# Patient Record
Sex: Female | Born: 1980 | Race: White | Hispanic: No | Marital: Married | Smoking: Never smoker
Health system: Southern US, Community
[De-identification: ages and names within clinical notes are randomized; demographics above are authoritative.]

## PROBLEM LIST (undated history)

## (undated) DIAGNOSIS — I2699 Other pulmonary embolism without acute cor pulmonale: Secondary | ICD-10-CM

## (undated) DIAGNOSIS — I809 Phlebitis and thrombophlebitis of unspecified site: Secondary | ICD-10-CM

## (undated) DIAGNOSIS — E7800 Pure hypercholesterolemia, unspecified: Secondary | ICD-10-CM

## (undated) DIAGNOSIS — D649 Anemia, unspecified: Secondary | ICD-10-CM

## (undated) DIAGNOSIS — R51 Headache: Principal | ICD-10-CM

## (undated) DIAGNOSIS — M1990 Unspecified osteoarthritis, unspecified site: Secondary | ICD-10-CM

## (undated) HISTORY — DX: Anemia, unspecified: D64.9

## (undated) HISTORY — DX: Pure hypercholesterolemia, unspecified: E78.00

## (undated) HISTORY — DX: Unspecified osteoarthritis, unspecified site: M19.90

## (undated) HISTORY — DX: Headache: R51

## (undated) HISTORY — DX: Other pulmonary embolism without acute cor pulmonale: I26.99

---

## 2008-10-18 HISTORY — PX: TUBAL LIGATION: SHX77

## 2010-08-18 ENCOUNTER — Emergency Department: Payer: Self-pay | Admitting: Emergency Medicine

## 2010-10-16 ENCOUNTER — Emergency Department: Payer: Self-pay | Admitting: Emergency Medicine

## 2010-12-28 ENCOUNTER — Emergency Department: Payer: Self-pay | Admitting: Emergency Medicine

## 2011-02-14 ENCOUNTER — Emergency Department: Payer: Self-pay | Admitting: Emergency Medicine

## 2011-06-02 ENCOUNTER — Emergency Department: Payer: Self-pay | Admitting: Emergency Medicine

## 2011-06-08 ENCOUNTER — Emergency Department: Payer: Self-pay | Admitting: Emergency Medicine

## 2011-06-15 ENCOUNTER — Ambulatory Visit (INDEPENDENT_AMBULATORY_CARE_PROVIDER_SITE_OTHER): Payer: Self-pay

## 2011-06-15 ENCOUNTER — Inpatient Hospital Stay (INDEPENDENT_AMBULATORY_CARE_PROVIDER_SITE_OTHER)
Admission: RE | Admit: 2011-06-15 | Discharge: 2011-06-15 | Disposition: A | Discharge status: Home / Self Care | Payer: Self-pay | Source: Ambulatory Visit | Attending: Family Medicine | Admitting: Family Medicine

## 2011-06-15 DIAGNOSIS — M722 Plantar fascial fibromatosis: Secondary | ICD-10-CM

## 2011-09-02 ENCOUNTER — Encounter: Payer: Self-pay | Admitting: Emergency Medicine

## 2011-09-02 ENCOUNTER — Emergency Department (INDEPENDENT_AMBULATORY_CARE_PROVIDER_SITE_OTHER)
Admission: EM | Admit: 2011-09-02 | Discharge: 2011-09-02 | Disposition: A | Discharge status: Home / Self Care | Payer: Self-pay | Source: Home / Self Care | Attending: Emergency Medicine | Admitting: Emergency Medicine

## 2011-09-02 DIAGNOSIS — J029 Acute pharyngitis, unspecified: Secondary | ICD-10-CM

## 2011-09-02 DIAGNOSIS — K089 Disorder of teeth and supporting structures, unspecified: Secondary | ICD-10-CM

## 2011-09-02 DIAGNOSIS — H9209 Otalgia, unspecified ear: Secondary | ICD-10-CM

## 2011-09-02 DIAGNOSIS — K0889 Other specified disorders of teeth and supporting structures: Secondary | ICD-10-CM

## 2011-09-02 MED ORDER — ACETAMINOPHEN-CODEINE #3 300-30 MG PO TABS: 1.0000 | ORAL_TABLET | Freq: Four times a day (QID) | ORAL | Status: AC | PRN

## 2011-09-02 NOTE — ED Provider Notes (Addendum)
History     CSN: [id number] Arrival date & time: 09/02/2011  8:32 AM   First MD Initiated Contact with Patient 09/02/11 [phone]      Chief Complaint  Patient presents with  . Sore Throat    (Consider location/radiation/quality/duration/timing/severity/associated sxs/prior treatment) HPI Comments: My upper R tooth has been bothering me for about 2 week,then started with pain in my ear,wanted to make sure is not infected or something".. Since Monday been having a sore throat and runny and congested nose a bit of cough"  Patient is a 30 y.o. female presenting with ear pain.  Otalgia This is a new problem. There is pain in the right ear. The problem occurs every several days. The problem has not changed since onset.There has been no fever. The pain is at a severity of 3/10. The pain is moderate. Associated symptoms include rhinorrhea, sore throat and cough. Pertinent negatives include no ear discharge, no hearing loss and no rash. Her past medical history does not include chronic ear infection, hearing loss or tympanostomy tube.    History reviewed. No pertinent past medical history.  Past Surgical History  Procedure Date  . Tubal ligation     History reviewed. No pertinent family history.  History  Substance Use Topics  . Smoking status: Never Smoker   . Smokeless tobacco: Not on file  . Alcohol Use: No    OB History    Grav Para Term Preterm Abortions TAB SAB Ect Mult Living                  Review of Systems  HENT: Positive for ear pain, sore throat and rhinorrhea. Negative for hearing loss and ear discharge.   Respiratory: Positive for cough.   Skin: Negative for rash.    Allergies  Review of patient's allergies indicates no known allergies.  Home Medications   Current Outpatient Rx  Name Route Sig Dispense Refill  . ACETAMINOPHEN-CODEINE #3 300-30 MG PO TABS Oral Take 1-2 tablets by mouth every 6 (six) hours as needed for pain. 15 tablet 0    BP 108/70   Pulse 81  Temp(Src) 97.5 F (36.4 C) (Oral)  Resp 18  SpO2 100%  LMP 08/31/2011  Physical Exam  Constitutional: Vital signs are normal. She appears well-developed and well-nourished. No distress.  HENT:  Head: Normocephalic.  Right Ear: Tympanic membrane normal.  Left Ear: Tympanic membrane normal.  Mouth/Throat: Uvula is midline and mucous membranes are normal. No dental abscesses, uvula swelling or dental caries. Posterior oropharyngeal erythema present. No tonsillar abscesses.  Eyes: Conjunctivae are normal. Right eye exhibits no discharge. Left eye exhibits no discharge.  Neck: Normal range of motion.  Pulmonary/Chest: Effort normal and breath sounds normal. No respiratory distress. She has no wheezes.  Lymphadenopathy:    She has no cervical adenopathy.  Neurological: She is alert.  Skin: Skin is warm.    ED Course  Procedures (including critical care time)   Labs Reviewed  POCT RAPID STREP A ([facility] CARE ONLY)   No results found.   1. Pharyngitis   2. Otalgia   3. Pain, dental       MDM  Pharyngitis- otalgia secondary to odontalgias?        Jimmie Molly, MD 09/02/11 5621  Jimmie Molly, MD 09/02/11 [phone]

## 2011-09-02 NOTE — ED Notes (Signed)
Initially had an earache thinking it was a tooth issue, onset 2 weeks ago.  Sore throat, stuffy head onset Monday.  Spouse with similar symptoms

## 2011-11-26 ENCOUNTER — Ambulatory Visit: Payer: Self-pay | Admitting: Family

## 2012-02-22 ENCOUNTER — Ambulatory Visit (INDEPENDENT_AMBULATORY_CARE_PROVIDER_SITE_OTHER): Payer: Self-pay | Admitting: Family

## 2012-02-22 ENCOUNTER — Encounter: Payer: Self-pay | Admitting: Family

## 2012-02-22 VITALS — BP 100/70 | HR 73 | Temp 97.7°F | Ht 65.25 in | Wt 171.0 lb

## 2012-02-22 DIAGNOSIS — Z111 Encounter for screening for respiratory tuberculosis: Secondary | ICD-10-CM

## 2012-02-22 NOTE — Progress Notes (Signed)
  Subjective:    Patient ID: Sarah Leach, female    DOB: [birth year], 31 y.o.   MRN: [id number]  HPI 31 year old WF is in today to be established and to have a TB skin test administerd. She denies any concerns today. She will be working for [employer], 3rd shift.     Review of Systems  Constitutional: Negative.   HENT: Negative.   Respiratory: Negative.   Cardiovascular: Negative.   Skin: Negative.   Neurological: Negative.   Hematological: Negative.   Psychiatric/Behavioral: Negative.    History reviewed. No pertinent past medical history.  History   Social History  . Marital Status: Married    Spouse Name: N/A    Number of Children: N/A  . Years of Education: N/A   Occupational History  . Not on file.   Social History Main Topics  . Smoking status: Never Smoker   . Smokeless tobacco: Never Used  . Alcohol Use: No  . Drug Use: No  . Sexually Active: Not on file   Other Topics Concern  . Not on file   Social History Narrative  . No narrative on file    Past Surgical History  Procedure Date  . Tubal ligation 2010    Family History  Problem Relation Age of Onset  . Arthritis Maternal Grandmother   . Hearing loss Maternal Grandmother   . Arthritis Maternal Grandfather   . Hearing loss Maternal Grandfather     No Known Allergies  No current outpatient prescriptions on file prior to visit.    BP 100/70  Pulse 73  Temp(Src) 97.7 F (36.5 C) (Oral)  Ht 5' 5.25" (1.657 m)  Wt 171 lb (77.565 kg)  BMI 28.24 kg/m2  SpO2 97%  LMP 04/15/2013chart    Objective:   Physical Exam  Constitutional: She is oriented to person, place, and time. She appears well-developed and well-nourished.  Neck: Normal range of motion. Neck supple.  Cardiovascular: Normal rate, regular rhythm and normal heart sounds.   Pulmonary/Chest: Effort normal.  Neurological: She is alert and oriented to person, place, and time.  Skin: Skin is warm and dry.    Psychiatric: She has a normal mood and affect.          Assessment & Plan:  Assessment: Encounter for TB skin test  Plan:TB skin test administered. Patient will return in 2 days to have it read.

## 2012-02-24 LAB — TB SKIN TEST: Induration: 0

## 2012-03-13 ENCOUNTER — Emergency Department (HOSPITAL_COMMUNITY)
Admission: EM | Admit: 2012-03-13 | Discharge: 2012-03-13 | Disposition: A | Discharge status: Home / Self Care | Payer: Self-pay | Attending: Emergency Medicine | Admitting: Emergency Medicine

## 2012-03-13 DIAGNOSIS — K029 Dental caries, unspecified: Secondary | ICD-10-CM | POA: Insufficient documentation

## 2012-03-13 DIAGNOSIS — K0889 Other specified disorders of teeth and supporting structures: Secondary | ICD-10-CM

## 2012-03-13 MED ORDER — PENICILLIN V POTASSIUM 500 MG PO TABS: 500.0000 mg | ORAL_TABLET | Freq: Three times a day (TID) | ORAL | Status: AC

## 2012-03-13 MED ORDER — OXYCODONE-ACETAMINOPHEN 5-325 MG PO TABS
2.0000 | ORAL_TABLET | Freq: Once | ORAL | Status: AC
  Administered 2012-03-13: 2 via ORAL
  Filled 2012-03-13: qty 2

## 2012-03-13 MED ORDER — OXYCODONE-ACETAMINOPHEN 5-325 MG PO TABS: 1.0000 | ORAL_TABLET | Freq: Four times a day (QID) | ORAL | Status: AC | PRN

## 2012-03-13 NOTE — Discharge Instructions (Signed)
You have a dental injury. Use the resource guide listed below to help you find a dentist if you do not already have one to followup with. It is very important that you get evaluated by a dentist as soon as possible. Call tomorrow to schedule an appointment. Use your pain medication as prescribed and do not operate heavy machinery while on pain medication. Note that your pain medication contains acetaminophen (Tylenol) & its is not reccommended that you use additional acetaminophen (Tylenol) while taking this medication. Take your full course of antibiotics. Read the instructions below.  Eat a soft or liquid diet and rinse your mouth out after meals with warm water. You should see a dentist or return here at once if you have increased swelling, increased pain or uncontrolled bleeding from the site of your injury.   SEEK MEDICAL CARE IF:   You have increased pain not controlled with medicines.   You have swelling around your tooth, in your face or neck.   You have bleeding which starts, continues, or gets worse.   You have a fever >101  If you are unable to open your mouth  RESOURCE GUIDE  Dental Problems  Patients with Medicaid: [facility]                     [facility] [address]                                           [address] Phone:  [phone]                                                  Phone:  [phone]  If unable to pay or uninsured, contact:  [facility] or [facility] to become qualified for the adult dental clinic.  Chronic Pain Problems Contact [facility]  [phone] Patients need to be referred by their primary care doctor.  Insufficient Money for Medicine Contact [employer]:  call "211" or [facility] 271-5999.  No Primary Care Doctor Call [facility]  832-8000 Other agencies that provide inexpensive medical care    [facility]  832-8035    [facility]  832-7272    [facility]  [phone]    [facility]  832-4777    [facility]  [phone]    [facility]  [phone]  Psychological Services [facility]  [phone] [facility]  [phone] [facility]   800 853-5163 (emergency services 641-4993)  Substance Abuse Resources [facility]  336-882-2125 [facility] 336-784-9470 [facility] 336-285-9073 [facility] 336-845-3988 Residential & Outpatient Substance Abuse Program  800-659-3381  Abuse/Neglect [place] Child Abuse Hotline (336) 641-3795 [place] Child Abuse Hotline 800-378-5315 (After Hours)  Emergency Shelter [facility] (336) 271-5985  [facility] (336) 275-9566 [facility] (704) 372-4663  MRSA Hotline #:   832-7006    [place] Resources  [facility] [place]     [employer]                            [facility] [address] [place]                       [address]      [address]  [place]                                                [place]                            [place] Phone:  [phone]                                   Phone:  [phone]                 Phone:  [phone]  [facility] Phone:  [phone]  [place] Child Abuse Hotline (336) 342-1394 (336) 342-3537 (After Hours)    

## 2012-03-13 NOTE — ED Provider Notes (Signed)
History     CSN: [id number]  Arrival date & time 03/13/12  0935   First MD Initiated Contact with Patient 03/13/12 [phone]      No chief complaint on file.   (Consider location/radiation/quality/duration/timing/severity/associated sxs/prior treatment) HPI Comments: Patient presents with right lower molar tooth pain for the past 2 days.  She does not have a dentist.  She has tried taking Aleve and Tylenol for the pain, which has not helped.    Patient is a 31 y.o. female presenting with tooth pain. The history is provided by the patient.  Dental PainThe primary symptoms include mouth pain. Primary symptoms do not include dental injury, oral bleeding, oral lesions, fever, shortness of breath or angioedema. The symptoms began 2 days ago. The symptoms are worsening. The symptoms occur constantly.  Additional symptoms include: dental sensitivity to temperature, gum swelling, gum tenderness and facial swelling. Additional symptoms do not include: purulent gums, trismus, jaw pain, trouble swallowing, pain with swallowing, excessive salivation, drooling and ear pain.    No past medical history on file.  Past Surgical History  Procedure Date  . Tubal ligation 2010    Family History  Problem Relation Age of Onset  . Arthritis Maternal Grandmother   . Hearing loss Maternal Grandmother   . Arthritis Maternal Grandfather   . Hearing loss Maternal Grandfather     History  Substance Use Topics  . Smoking status: Never Smoker   . Smokeless tobacco: Never Used  . Alcohol Use: No    OB History    Grav Para Term Preterm Abortions TAB SAB Ect Mult Living                  Review of Systems  Constitutional: Negative for fever and chills.  HENT: Positive for facial swelling and dental problem. Negative for ear pain, drooling, trouble swallowing, neck pain and neck stiffness.   Respiratory: Negative for shortness of breath.   Gastrointestinal: Negative for nausea and vomiting.     Allergies  Review of patient's allergies indicates no known allergies.  Home Medications   Current Outpatient Rx  Name Route Sig Dispense Refill  . NAPROXEN SODIUM 220 MG PO TABS Oral Take 220 mg by mouth 2 (two) times daily as needed. For pain.      BP 133/91  Pulse 81  Temp(Src) 97.4 F (36.3 C) (Oral)  Resp 16  SpO2 99%  LMP 03/01/2012  Physical Exam  Nursing note and vitals reviewed. Constitutional: She is oriented to person, place, and time. She appears well-developed and well-nourished. She appears distressed.       tearful  HENT:  Head: Normocephalic and atraumatic. No trismus in the jaw.  Mouth/Throat: Uvula is midline, oropharynx is clear and moist and mucous membranes are normal. Abnormal dentition. No dental abscesses or uvula swelling. No oropharyngeal exudate, posterior oropharyngeal edema, posterior oropharyngeal erythema or tonsillar abscesses.         Poor dental hygiene. Pt able to open and close mouth with out difficulty. Airway intact. Uvula midline. Mild gingival swelling with tenderness over affected area, but no fluctuance. No swelling or tenderness of submental and submandibular regions.  Eyes: Conjunctivae and EOM are normal.  Neck: Normal range of motion and full passive range of motion without pain. Neck supple.  Cardiovascular: Normal rate, regular rhythm and normal heart sounds.   Pulmonary/Chest: Effort normal and breath sounds normal. No stridor. No respiratory distress. She has no wheezes.  Musculoskeletal: Normal range of motion.  Lymphadenopathy:  Head (right side): No submental, no submandibular, no tonsillar, no preauricular and no posterior auricular adenopathy present.       Head (left side): No submental, no submandibular, no tonsillar, no preauricular and no posterior auricular adenopathy present.    She has no cervical adenopathy.  Neurological: She is alert and oriented to person, place, and time.  Skin: Skin is warm and dry.  No rash noted. She is not diaphoretic.  Psychiatric: She has a normal mood and affect.    ED Course  Procedures (including critical care time)  Labs Reviewed - No data to display No results found.   No diagnosis found.    MDM  Patient with toothache.  No gross abscess.  Exam unconcerning for Ludwig's angina or spread of infection.  Will treat with penicillin and pain medicine.  Urged patient to follow-up with dentist.          Pascal Lux [place], PA-C 03/13/12 1104

## 2012-03-13 NOTE — ED Notes (Signed)
Started Saturday with tooth pain. Now very painful and swollen.

## 2012-03-15 NOTE — ED Provider Notes (Signed)
History/physical exam/procedure(s) were performed by non-physician practitioner and as supervising physician I was immediately available for consultation/collaboration. I have reviewed all notes and am in agreement with care and plan.   Alexya Mcdaris S Lyric Hoar, MD 03/15/12 1859 

## 2012-06-08 ENCOUNTER — Encounter (HOSPITAL_COMMUNITY): Payer: Self-pay | Admitting: Emergency Medicine

## 2012-06-08 ENCOUNTER — Emergency Department (HOSPITAL_COMMUNITY)
Admission: EM | Admit: 2012-06-08 | Discharge: 2012-06-08 | Disposition: A | Discharge status: Home / Self Care | Payer: Self-pay | Attending: Emergency Medicine | Admitting: Emergency Medicine

## 2012-06-08 DIAGNOSIS — R21 Rash and other nonspecific skin eruption: Secondary | ICD-10-CM | POA: Insufficient documentation

## 2012-06-08 DIAGNOSIS — L255 Unspecified contact dermatitis due to plants, except food: Secondary | ICD-10-CM | POA: Insufficient documentation

## 2012-06-08 DIAGNOSIS — T622X1A Toxic effect of other ingested (parts of) plant(s), accidental (unintentional), initial encounter: Secondary | ICD-10-CM | POA: Insufficient documentation

## 2012-06-08 MED ORDER — DIPHENHYDRAMINE HCL 25 MG PO CAPS
50.0000 mg | ORAL_CAPSULE | Freq: Once | ORAL | Status: AC
  Administered 2012-06-08: 50 mg via ORAL
  Filled 2012-06-08 (×2): qty 1

## 2012-06-08 MED ORDER — DIPHENHYDRAMINE HCL 25 MG PO TABS: 25.0000 mg | ORAL_TABLET | Freq: Four times a day (QID) | ORAL | Status: DC | PRN

## 2012-06-08 MED ORDER — PREDNISONE 20 MG PO TABS: 40.0000 mg | ORAL_TABLET | Freq: Every day | ORAL | Status: AC

## 2012-06-08 MED ORDER — PREDNISONE 20 MG PO TABS
60.0000 mg | ORAL_TABLET | Freq: Once | ORAL | Status: AC
  Administered 2012-06-08: 60 mg via ORAL
  Filled 2012-06-08: qty 3

## 2012-06-08 MED ORDER — PREDNISONE 20 MG PO TABS
ORAL_TABLET | ORAL | Status: AC
  Filled 2012-06-08: qty 1

## 2012-06-08 NOTE — Progress Notes (Signed)
[facility] ED CM contacted by triage RN.  CM spoke with [facility] pharmacy staff who confirms pt is eligible for chs indigent medication assistance.

## 2012-06-08 NOTE — ED Provider Notes (Signed)
History     CSN: [id number]  Arrival date & time 06/08/12  6295   First MD Initiated Contact with Patient 06/08/12 1005      Chief Complaint  Patient presents with  . Rash  . Poison Ivy    (Consider location/radiation/quality/duration/timing/severity/associated sxs/prior treatment) HPI Comments: Sarah Leach 31 y.o. female   The chief complaint is: Patient presents with:   Rash   Poison [place]   The patient has medical history significant for:   History reviewed. No pertinent past medical history.   Patient presents with rash on her arms bilaterally and lower abomen X 1 week. She states that the rash it itch and worse at night. She tried OTC cortisone cream without relief. Patient states she came into contact with posion ivy while working in the yard. Denies changes in lotion, detergent, or soap. Denies constitutional symptoms. Denies NVD. Denies SOB.  Patient is a 31 y.o. female presenting with rash and Poison Ivy. The history is provided by the patient.  Rash   Poison Ivy Associated symptoms include a rash. Pertinent negatives include no chills, fever, nausea or vomiting.    History reviewed. No pertinent past medical history.  Past Surgical History  Procedure Date  . Tubal ligation 2010    Family History  Problem Relation Age of Onset  . Arthritis Maternal Grandmother   . Hearing loss Maternal Grandmother   . Arthritis Maternal Grandfather   . Hearing loss Maternal Grandfather     History  Substance Use Topics  . Smoking status: Never Smoker   . Smokeless tobacco: Never Used  . Alcohol Use: No    OB History    Grav Para Term Preterm Abortions TAB SAB Ect Mult Living                  Review of Systems  Constitutional: Negative for fever and chills.  Respiratory: Negative for shortness of breath.   Gastrointestinal: Negative for nausea, vomiting and diarrhea.  Skin: Positive for rash.  All other systems reviewed and are negative.    Allergies   Review of patient's allergies indicates no known allergies.  Home Medications   Current Outpatient Rx  Name Route Sig Dispense Refill  . NAPROXEN SODIUM 220 MG PO TABS Oral Take 220 mg by mouth 2 (two) times daily as needed. For pain.      BP 122/71  Pulse 64  Temp 98.6 F (37 C) (Oral)  Resp 17  SpO2 100%  LMP 06/01/2012  Physical Exam  Nursing note and vitals reviewed. Constitutional: She appears well-developed and well-nourished. No distress.  HENT:  Head: Normocephalic and atraumatic.  Mouth/Throat: Oropharynx is clear and moist.  Eyes: Conjunctivae and EOM are normal. No scleral icterus.  Neck: Normal range of motion. Neck supple.  Cardiovascular: Normal rate, regular rhythm and normal heart sounds.   Pulmonary/Chest: Effort normal and breath sounds normal.  Abdominal: Soft. Bowel sounds are normal. There is no tenderness.  Neurological: She is alert.  Skin: Skin is warm and dry. Rash noted. There is erythema.       Maculopapular rash distrbuted on the flexoral surfaces of both arm bilaterally. Additional lesions on suprapubic area.    ED Course  Procedures (including critical care time)  Labs Reviewed - No data to display No results found.   1. Rash       MDM  Patient presented with posion ivy x 1 week. OTC cortisone cream without relief. Patient given benadryl and prednisone in ED.  Patient reassessed and improved. Patient will be discharged on benadryl and prednisone. No red flags for contact dermatitis, TENS, SJS. Return precautions given verbally and in discharge summary.        Pixie Casino, PA-C 06/08/12 1124

## 2012-06-08 NOTE — ED Notes (Signed)
Waiting for case manager to provided medications from pharmacy

## 2012-06-08 NOTE — ED Notes (Signed)
Was working in yard last week- thinks has poison ivy, rash on both arms, more on left arm, some on abd.

## 2012-06-08 NOTE — Progress Notes (Signed)
During 06/08/12 ED Visit pt was assisted with [facility] medication assistance program for benadryl and prednisone

## 2012-06-09 NOTE — ED Provider Notes (Signed)
Medical screening examination/treatment/procedure(s) were performed by non-physician practitioner and as supervising physician I was immediately available for consultation/collaboration.  Nya Monds, MD 06/09/12 0817 

## 2012-06-21 ENCOUNTER — Ambulatory Visit: Payer: Self-pay | Admitting: Family

## 2012-06-22 ENCOUNTER — Ambulatory Visit (INDEPENDENT_AMBULATORY_CARE_PROVIDER_SITE_OTHER): Payer: Self-pay | Admitting: Family Medicine

## 2012-06-22 ENCOUNTER — Encounter: Payer: Self-pay | Admitting: Family Medicine

## 2012-06-22 VITALS — BP 116/68 | HR 76 | Temp 99.1°F | Wt 169.0 lb

## 2012-06-22 DIAGNOSIS — L259 Unspecified contact dermatitis, unspecified cause: Secondary | ICD-10-CM

## 2012-06-22 DIAGNOSIS — L509 Urticaria, unspecified: Secondary | ICD-10-CM

## 2012-06-22 MED ORDER — PREDNISONE 10 MG PO TABS: ORAL_TABLET | ORAL | Status: DC

## 2012-06-22 MED ORDER — TRIAMCINOLONE ACETONIDE 0.1 % EX CREA: TOPICAL_CREAM | Freq: Three times a day (TID) | CUTANEOUS | Status: DC

## 2012-06-22 NOTE — Progress Notes (Signed)
  Subjective:    Patient ID: Sarah Leach, female    DOB: 10-05-1981, 31 y.o.   MRN: [id number]  HPI Here for diffuse hives on the arms and trunk for the past 2 weeks. She went to the ER a week ago, and she was given a 9 day taper of prednisone. This helped at first but then the rash worsened again. No SOB. Using Calamine lotion.    Review of Systems  Constitutional: Negative.   Skin: Positive for rash.       Objective:   Physical Exam  Constitutional: She appears well-developed and well-nourished.  Skin:       Diffuse maculopapular red scaly rashes as above           Assessment & Plan:  Hives secondary to contact dermatitis. Use Triamcinolone cream and a longer prednisone taper. Add Aveeno soaks in the tub.

## 2012-08-10 ENCOUNTER — Emergency Department (HOSPITAL_COMMUNITY): Payer: Self-pay

## 2012-08-10 ENCOUNTER — Encounter (HOSPITAL_COMMUNITY): Payer: Self-pay | Admitting: *Deleted

## 2012-08-10 ENCOUNTER — Emergency Department (HOSPITAL_COMMUNITY)
Admission: EM | Admit: 2012-08-10 | Discharge: 2012-08-11 | Disposition: A | Discharge status: Home / Self Care | Payer: Self-pay | Attending: Emergency Medicine | Admitting: Emergency Medicine

## 2012-08-10 DIAGNOSIS — E876 Hypokalemia: Secondary | ICD-10-CM

## 2012-08-10 DIAGNOSIS — R42 Dizziness and giddiness: Secondary | ICD-10-CM | POA: Insufficient documentation

## 2012-08-10 DIAGNOSIS — R109 Unspecified abdominal pain: Secondary | ICD-10-CM

## 2012-08-10 DIAGNOSIS — J069 Acute upper respiratory infection, unspecified: Secondary | ICD-10-CM

## 2012-08-10 DIAGNOSIS — R1031 Right lower quadrant pain: Secondary | ICD-10-CM | POA: Insufficient documentation

## 2012-08-10 DIAGNOSIS — H8110 Benign paroxysmal vertigo, unspecified ear: Secondary | ICD-10-CM

## 2012-08-10 LAB — CBC WITH DIFFERENTIAL/PLATELET
Basophils Absolute: 0 10*3/uL (ref 0.0–0.1)
HCT: 32.1 % — ABNORMAL LOW (ref 36.0–46.0)
Lymphocytes Relative: 23 % (ref 12–46)
Lymphs Abs: 1.3 10*3/uL (ref 0.7–4.0)
MCV: 83.2 fL (ref 78.0–100.0)
Monocytes Absolute: 0.5 10*3/uL (ref 0.1–1.0)
Neutro Abs: 3.7 10*3/uL (ref 1.7–7.7)
Platelets: 340 10*3/uL (ref 150–400)
RBC: 3.86 MIL/uL — ABNORMAL LOW (ref 3.87–5.11)
RDW: 13.3 % (ref 11.5–15.5)
WBC: 5.5 10*3/uL (ref 4.0–10.5)

## 2012-08-10 LAB — COMPREHENSIVE METABOLIC PANEL
ALT: 8 U/L (ref 0–35)
AST: 16 U/L (ref 0–37)
CO2: 25 mEq/L (ref 19–32)
Chloride: 102 mEq/L (ref 96–112)
GFR calc Af Amer: >90 mL/min (ref >90)
GFR calc non Af Amer: >90 mL/min (ref >90)
Glucose, Bld: 100 mg/dL — ABNORMAL HIGH (ref 70–99)
Sodium: 136 mEq/L (ref 135–145)
Total Bilirubin: 0.2 mg/dL — ABNORMAL LOW (ref 0.3–1.2)

## 2012-08-10 MED ORDER — MORPHINE SULFATE 4 MG/ML IJ SOLN
4.0000 mg | Freq: Once | INTRAMUSCULAR | Status: AC
  Administered 2012-08-10: 4 mg via INTRAVENOUS
  Filled 2012-08-10: qty 1

## 2012-08-10 MED ORDER — LORAZEPAM 2 MG/ML IJ SOLN
1.0000 mg | Freq: Once | INTRAMUSCULAR | Status: AC
  Administered 2012-08-10: 1 mg via INTRAVENOUS
  Filled 2012-08-10: qty 1

## 2012-08-10 MED ORDER — POTASSIUM CHLORIDE 10 MEQ/100ML IV SOLN
10.0000 meq | Freq: Once | INTRAVENOUS | Status: AC
  Administered 2012-08-11: 10 meq via INTRAVENOUS
  Filled 2012-08-10: qty 100

## 2012-08-10 MED ORDER — SODIUM CHLORIDE 0.9 % IV BOLUS (SEPSIS)
1000.0000 mL | Freq: Once | INTRAVENOUS | Status: AC
  Administered 2012-08-10: 1000 mL via INTRAVENOUS

## 2012-08-10 MED ORDER — POTASSIUM CHLORIDE CRYS ER 20 MEQ PO TBCR
40.0000 meq | EXTENDED_RELEASE_TABLET | Freq: Once | ORAL | Status: AC
  Administered 2012-08-11: 40 meq via ORAL
  Filled 2012-08-10: qty 2

## 2012-08-10 NOTE — ED Notes (Signed)
Per EMS pt c/onausea and dizziness starting today. No vomiting or diarrhea, VSS.

## 2012-08-10 NOTE — ED Provider Notes (Signed)
History     CSN: [id number]  Arrival date & time 08/10/12  2104   First MD Initiated Contact with Patient 08/10/12 2135      Chief Complaint  Patient presents with  . Nausea  . Dizziness    (Consider location/radiation/quality/duration/timing/severity/associated sxs/prior treatment) HPI History provided by pt.   Pt developed "cold" sx, including non-productive cough, nasal and sinus congestion and rhinorrhea 2 days ago.  This evening she had such severe body aches that she called EMS.  Just prior to EMS arrival, she developed intermittent room-spinning dizziness that occurs when she moves and resolves w/in minutes of keeping still.  Denies headache, vision changes and ataxia.  No prior h/o vertigo.  While en route to hospital, she developed pain in RLQ.  This pain is also aggravated by movement.  Has had a poor appetite over the past 2 days.  Denies fever, change in bowels, urinary and vaginal sx.  Past abd surgeries include tubal ligation.    History reviewed. No pertinent past medical history.  Past Surgical History  Procedure Date  . Tubal ligation 2010    Family History  Problem Relation Age of Onset  . Arthritis Maternal Grandmother   . Hearing loss Maternal Grandmother   . Arthritis Maternal Grandfather   . Hearing loss Maternal Grandfather     History  Substance Use Topics  . Smoking status: Never Smoker   . Smokeless tobacco: Never Used  . Alcohol Use: No    OB History    Grav Para Term Preterm Abortions TAB SAB Ect Mult Living                  Review of Systems  All other systems reviewed and are negative.    Allergies  Review of patient's allergies indicates no known allergies.  Home Medications   Current Outpatient Rx  Name Route Sig Dispense Refill  . DIPHENHYDRAMINE HCL 25 MG PO TABS Oral Take 1 tablet (25 mg total) by mouth every 6 (six) hours as needed for itching (Rash). 30 tablet 0    BP 141/81  Pulse 73  Temp 98.5 F (36.9 C) (Oral)   Resp 16  SpO2 100%  Physical Exam  Nursing note and vitals reviewed. Constitutional: She is oriented to person, place, and time. She appears well-developed and well-nourished. No distress.  HENT:  Head: Normocephalic and atraumatic.  Mouth/Throat: Oropharynx is clear and moist. No oropharyngeal exudate.       Nml ear canal and TM bilaterally  Eyes:       Normal appearance  Neck: Normal range of motion.  Cardiovascular: Normal rate, regular rhythm and intact distal pulses.   Pulmonary/Chest: Effort normal and breath sounds normal. No respiratory distress.       coughing  Abdominal: Soft. Bowel sounds are normal. She exhibits no distension.       Diffuse tenderness, worst in RLQ.  Pt becomes tearful.  Possible peritoneal signs as well (pain when shake hips and tap bottom of right heel).   Musculoskeletal: Normal range of motion.  Neurological: She is alert and oriented to person, place, and time. No sensory deficit. Coordination normal.       CN 3-12 intact.  Rotational nystagmus.  Pt denies dizziness w/ eye movement and head rotation. 5/5 and equal upper and lower extremity strength.  No past pointing.  Unable to assess gait because patient became very dizzy upon standing.   Skin: Skin is warm and dry. No rash noted.  Psychiatric:  She has a normal mood and affect. Her behavior is normal.    ED Course  Procedures (including critical care time)  Labs Reviewed - No data to display No results found.   1. Viral URI   2. Abdominal wall pain   3. Hypokalemia   4. BPPV (benign paroxysmal positional vertigo)       MDM  31yo healthy F pt presents w/ URI sx x 2 days.  Doubt pneumonia based on duration of sx, vital signs and pulmonary exam.  Also c/o room-spinning dizziness that started just prior to EMS arrival this evening.  Most likely BPPV;  no recent head trauma and denies headache, normal TMs and no auditory sx and on exam, movement illicits dizziness and it resolves w/in 10  seconds of rest, rotational nystagmus, no focal neuro deficits.  Dizziness resolved w/ 1mg  IV ativan and patient walks asymptomatically w/ nml gait. Also c/o RLQ pain that started en route to hospital.  Exam concerning for appendicitis.  CT abd neg.  Doubt TOA or ovarian torsion based on characteristics of pain and exam.  Labs sig for hypokalemia; potassium replaced w/ IV and po.  Pt reports that this is a chronic problem for her.  D/c'd home w/ vicodin for pain and strict return precautions discussed.  1:19 AM         Otilio Miu, PA 08/11/12 [phone]

## 2012-08-10 NOTE — ED Notes (Addendum)
Report received from Ajsa, Charity fundraiser.

## 2012-08-11 MED ORDER — IOHEXOL 300 MG/ML  SOLN
100.0000 mL | Freq: Once | INTRAMUSCULAR | Status: AC | PRN
  Administered 2012-08-11: 100 mL via INTRAVENOUS

## 2012-08-11 MED ORDER — HYDROCODONE-ACETAMINOPHEN 5-500 MG PO TABS: 1.0000 | ORAL_TABLET | Freq: Four times a day (QID) | ORAL | Status: DC | PRN

## 2012-08-11 NOTE — ED Notes (Signed)
Discharge instructions reviewed. Rx given x1. All questions answered. Work note given for Friday August 11, 2012

## 2012-08-14 NOTE — ED Provider Notes (Signed)
Medical screening examination/treatment/procedure(s) were performed by non-physician practitioner and as supervising physician I was immediately available for consultation/collaboration.  Juliet Rude. Rubin Payor, MD 08/14/12 [phone]

## 2012-09-04 ENCOUNTER — Encounter: Payer: Self-pay | Admitting: Family

## 2012-09-04 ENCOUNTER — Ambulatory Visit (INDEPENDENT_AMBULATORY_CARE_PROVIDER_SITE_OTHER): Payer: Self-pay | Admitting: Family

## 2012-09-04 VITALS — BP 116/70 | HR 65 | Temp 98.5°F | Wt 175.0 lb

## 2012-09-04 DIAGNOSIS — M545 Low back pain: Secondary | ICD-10-CM

## 2012-09-04 DIAGNOSIS — J019 Acute sinusitis, unspecified: Secondary | ICD-10-CM

## 2012-09-04 MED ORDER — AMOXICILLIN 500 MG PO TABS: 1000.0000 mg | ORAL_TABLET | Freq: Two times a day (BID) | ORAL | Status: AC

## 2012-09-04 NOTE — Progress Notes (Signed)
  Subjective:    Patient ID: Sarah Leach, female    DOB: [birth year], 31 y.o.   MRN: [id number]  HPI 31 year old white female is in today with c/o cough, congestion, sinus pressure worsening x 1 week. She was seen in the ED for similar symptoms 3 weeks ago that she says never really resolved but eventually worsened. She was diagnosed with a viral URI and took OTC medication. Denies any environmental changes. Reports having pain in her lower right back that is worse with movement.   Review of Systems  Constitutional: Positive for fatigue.  HENT: Positive for congestion, sneezing and postnasal drip.   Eyes: Negative.   Respiratory: Positive for cough.   Cardiovascular: Negative.   Musculoskeletal: Negative.   Neurological: Negative.   Hematological: Negative.   Psychiatric/Behavioral: Negative.    No past medical history on file.  History   Social History  . Marital Status: Married    Spouse Name: N/A    Number of Children: N/A  . Years of Education: N/A   Occupational History  . Not on file.   Social History Main Topics  . Smoking status: Never Smoker   . Smokeless tobacco: Never Used  . Alcohol Use: No  . Drug Use: No  . Sexually Active: Not on file   Other Topics Concern  . Not on file   Social History Narrative  . No narrative on file    Past Surgical History  Procedure Date  . Tubal ligation 2010    Family History  Problem Relation Age of Onset  . Arthritis Maternal Grandmother   . Hearing loss Maternal Grandmother   . Arthritis Maternal Grandfather   . Hearing loss Maternal Grandfather     No Known Allergies  Current Outpatient Prescriptions on File Prior to Visit  Medication Sig Dispense Refill  . diphenhydrAMINE (BENADRYL) 25 MG tablet Take 1 tablet (25 mg total) by mouth every 6 (six) hours as needed for itching (Rash).  30 tablet  0  . HYDROcodone-acetaminophen (VICODIN) 5-500 MG per tablet Take 1-2 tablets by mouth every 6 (six) hours as needed  for pain.  20 tablet  0    BP 116/70  Pulse 65  Temp 98.5 F (36.9 C)  Wt 175 lb (79.379 kg)  SpO2 98%  LMP 10/08/2013chart    Objective:   Physical Exam  Constitutional: She is oriented to person, place, and time. She appears well-developed and well-nourished.  HENT:  Right Ear: External ear normal.  Left Ear: External ear normal.  Nose: Nose normal.  Mouth/Throat: Oropharynx is clear and moist.  Neck: Normal range of motion. Neck supple.  Cardiovascular: Normal rate, regular rhythm and normal heart sounds.   Pulmonary/Chest: Effort normal and breath sounds normal.  Musculoskeletal: Normal range of motion. She exhibits tenderness.       Tenderness to palpation of the right lower back.   Neurological: She is alert and oriented to person, place, and time.  Skin: Skin is warm and dry.  Psychiatric: She has a normal mood and affect.          Assessment & Plan:  Assessment: Acute sinusitis, Low back pain  Plan: Ibuprofen as needed for LBP. Due to duration of symptoms, Amoxil 2 caps twice a day x 10 days. OTC symptomatic treatment for relief. Call the office if symptoms worsen or persist. Recheck as scheduled and sooner as needed.

## 2012-09-04 NOTE — Patient Instructions (Signed)

## 2012-10-21 ENCOUNTER — Encounter (HOSPITAL_COMMUNITY): Payer: Self-pay | Admitting: Emergency Medicine

## 2012-10-21 ENCOUNTER — Emergency Department (HOSPITAL_COMMUNITY)
Admission: EM | Admit: 2012-10-21 | Discharge: 2012-10-21 | Disposition: A | Discharge status: Home / Self Care | Payer: Self-pay | Attending: Emergency Medicine | Admitting: Emergency Medicine

## 2012-10-21 ENCOUNTER — Emergency Department (HOSPITAL_COMMUNITY): Payer: Self-pay

## 2012-10-21 DIAGNOSIS — N760 Acute vaginitis: Secondary | ICD-10-CM | POA: Insufficient documentation

## 2012-10-21 DIAGNOSIS — R110 Nausea: Secondary | ICD-10-CM | POA: Insufficient documentation

## 2012-10-21 DIAGNOSIS — Z3202 Encounter for pregnancy test, result negative: Secondary | ICD-10-CM | POA: Insufficient documentation

## 2012-10-21 DIAGNOSIS — R109 Unspecified abdominal pain: Secondary | ICD-10-CM | POA: Insufficient documentation

## 2012-10-21 LAB — URINALYSIS, ROUTINE W REFLEX MICROSCOPIC
Nitrite: NEGATIVE
Protein, ur: NEGATIVE mg/dL
Urobilinogen, UA: 1 mg/dL (ref 0.0–1.0)

## 2012-10-21 LAB — CBC WITH DIFFERENTIAL/PLATELET
Basophils Absolute: 0 10*3/uL (ref 0.0–0.1)
Basophils Relative: 0 % (ref 0–1)
Eosinophils Absolute: 0.1 10*3/uL (ref 0.0–0.7)
MCH: 28.1 pg (ref 26.0–34.0)
MCHC: 33.2 g/dL (ref 30.0–36.0)
Neutrophils Relative %: 63 % (ref 43–77)
Platelets: 405 10*3/uL — ABNORMAL HIGH (ref 150–400)
RDW: 13.6 % (ref 11.5–15.5)

## 2012-10-21 LAB — BASIC METABOLIC PANEL
CO2: 24 mEq/L (ref 19–32)
Calcium: 9.4 mg/dL (ref 8.4–10.5)
Creatinine, Ser: 0.8 mg/dL (ref 0.50–1.10)

## 2012-10-21 LAB — WET PREP, GENITAL

## 2012-10-21 LAB — URINE MICROSCOPIC-ADD ON

## 2012-10-21 LAB — PREGNANCY, URINE: Preg Test, Ur: NEGATIVE

## 2012-10-21 MED ORDER — METRONIDAZOLE 500 MG PO TABS: 500.0000 mg | ORAL_TABLET | Freq: Two times a day (BID) | ORAL | Status: DC

## 2012-10-21 MED ORDER — SODIUM CHLORIDE 0.9 % IV BOLUS (SEPSIS)
500.0000 mL | Freq: Once | INTRAVENOUS | Status: AC
  Administered 2012-10-21: 500 mL via INTRAVENOUS

## 2012-10-21 MED ORDER — ONDANSETRON HCL 4 MG/2ML IJ SOLN
4.0000 mg | Freq: Once | INTRAMUSCULAR | Status: AC
  Administered 2012-10-21: 4 mg via INTRAVENOUS
  Filled 2012-10-21: qty 2

## 2012-10-21 MED ORDER — IOHEXOL 300 MG/ML  SOLN
100.0000 mL | Freq: Once | INTRAMUSCULAR | Status: AC | PRN
  Administered 2012-10-21: 100 mL via INTRAVENOUS

## 2012-10-21 MED ORDER — MORPHINE SULFATE 4 MG/ML IJ SOLN
4.0000 mg | Freq: Once | INTRAMUSCULAR | Status: AC
  Administered 2012-10-21: 4 mg via INTRAVENOUS
  Filled 2012-10-21: qty 1

## 2012-10-21 NOTE — ED Notes (Addendum)
Per EMS. Patient c/o RLQ pain, navel to lower right side. Patient states she is not pregnant, sharp pain, started about 12 hours ago, worseing throughout the day, rating pain 10/10. Last BM 10/20/12 am, tried again later in day, unable to go. Denies taking any medications at home for treatment. Patient denies N/V/D, afebrile.

## 2012-10-21 NOTE — ED Notes (Signed)
[id number]<AV> Expected date:10/20/12<BR> Expected time:11:57 PM<BR> Means of arrival:<BR> Comments:<BR> EMS

## 2012-10-21 NOTE — ED Notes (Signed)
Patient c/o RLQ pain, upon palpitation pain c/o tenderness to RLQ, RUQ, LLQ. Pain rated 10/10. Patient states onset to be about 12 hours ago. Last BM 10/20/12 am. Patient reports nausea, with one episode of emesis approx 2-3 hours ago.

## 2012-10-21 NOTE — ED Notes (Signed)
Pt finished contrast - notified Amie, RN

## 2012-10-21 NOTE — ED Notes (Signed)
CT notified patient has completed contrast  

## 2012-10-21 NOTE — ED Provider Notes (Signed)
History     CSN: [id number]  Arrival date & time 10/21/12  0015   First MD Initiated Contact with Patient 10/21/12 0116      Chief Complaint  Patient presents with  . Abdominal Pain    (Consider location/radiation/quality/duration/timing/severity/associated sxs/prior treatment) Patient is a 32 y.o. female presenting with abdominal pain. The history is provided by the patient.  Abdominal Pain The primary symptoms of the illness include abdominal pain and nausea. The primary symptoms of the illness do not include shortness of breath, vomiting or diarrhea.  Symptoms associated with the illness do not include back pain.   patient presents with abdominal pain and nausea. Began earlier today. It is sharp. It began in the periumbilical area and is now gone to the right lower quadrant. Last bowel movement was today. No fevers. No vaginal bleeding or discharge. Her last period was last week and was normal.  History reviewed. No pertinent past medical history.  Past Surgical History  Procedure Date  . Tubal ligation 2010    Family History  Problem Relation Age of Onset  . Arthritis Maternal Grandmother   . Hearing loss Maternal Grandmother   . Arthritis Maternal Grandfather   . Hearing loss Maternal Grandfather     History  Substance Use Topics  . Smoking status: Never Smoker   . Smokeless tobacco: Never Used  . Alcohol Use: No    OB History    Grav Para Term Preterm Abortions TAB SAB Ect Mult Living                  Review of Systems  Constitutional: Negative for activity change and appetite change.  HENT: Negative for neck stiffness.   Eyes: Negative for pain.  Respiratory: Negative for chest tightness and shortness of breath.   Cardiovascular: Negative for chest pain and leg swelling.  Gastrointestinal: Positive for nausea and abdominal pain. Negative for vomiting and diarrhea.  Genitourinary: Negative for flank pain.  Musculoskeletal: Negative for back pain.  Skin:  Negative for rash.  Neurological: Negative for weakness, numbness and headaches.  Psychiatric/Behavioral: Negative for behavioral problems.    Allergies  Review of patient's allergies indicates no known allergies.  Home Medications   Current Outpatient Rx  Name  Route  Sig  Dispense  Refill  . METRONIDAZOLE 500 MG PO TABS   Oral   Take 1 tablet (500 mg total) by mouth 2 (two) times daily.   14 tablet   0     BP 117/78  Pulse 62  Temp 98.3 F (36.8 C) (Oral)  Resp 14  SpO2 95%  LMP 10/14/2012  Physical Exam  Nursing note and vitals reviewed. Constitutional: She is oriented to person, place, and time. She appears well-developed and well-nourished.  HENT:  Head: Normocephalic and atraumatic.  Eyes: EOM are normal. Pupils are equal, round, and reactive to light.  Neck: Normal range of motion. Neck supple.  Cardiovascular: Normal rate, regular rhythm and normal heart sounds.   No murmur heard. Pulmonary/Chest: Effort normal and breath sounds normal. No respiratory distress. She has no wheezes. She has no rales.  Abdominal: Soft. Bowel sounds are normal. She exhibits no distension. There is tenderness. There is no rebound and no guarding.       Tenderness abdomen, worse in the right lower quadrant. No rash. No masses palpated  Musculoskeletal: Normal range of motion.  Neurological: She is alert and oriented to person, place, and time. No cranial nerve deficit.  Skin: Skin is warm  and dry.  Psychiatric: She has a normal mood and affect. Her speech is normal.  mld white vaginal discharge with some pain with cervical motion.  ED Course  Procedures (including critical care time)  Labs Reviewed  CBC WITH DIFFERENTIAL - Abnormal; Notable for the following:    Hemoglobin 11.7 (*)     HCT 35.2 (*)     Platelets 405 (*)     All other components within normal limits  URINALYSIS, ROUTINE W REFLEX MICROSCOPIC - Abnormal; Notable for the following:    Hgb urine dipstick TRACE (*)      Leukocytes, UA TRACE (*)     All other components within normal limits  BASIC METABOLIC PANEL - Abnormal; Notable for the following:    Glucose, Bld 101 (*)     All other components within normal limits  URINE MICROSCOPIC-ADD ON - Abnormal; Notable for the following:    Squamous Epithelial / LPF FEW (*)     All other components within normal limits  WET PREP, GENITAL - Abnormal; Notable for the following:    Trich, Wet Prep FEW (*)     Clue Cells Wet Prep HPF POC RARE (*)     WBC, Wet Prep HPF POC RARE (*)     All other components within normal limits  PREGNANCY, URINE  GC/CHLAMYDIA PROBE AMP   Ct Abdomen Pelvis W Contrast  10/21/2012  *RADIOLOGY REPORT*  Clinical Data: Right lower quadrant pain, diffuse abdominal tenderness, nausea and vomiting x 1  CT ABDOMEN AND PELVIS WITH CONTRAST  Technique:  Multidetector CT imaging of the abdomen and pelvis was performed following the standard protocol during bolus administration of intravenous contrast. Sagittal and coronal MPR images reconstructed from axial data set.  Contrast: OMNIPAQUE IOHEXOL 300 MG/ML  SOLN Dilute oral contrast.  Comparison: 08/10/2012  Findings: Lung bases clear. Liver, spleen, pancreas, kidneys, and adrenal glands normal. Normal appendix. Small umbilical hernia containing fat. Normal-appearing bladder, ureters, uterus and right ovary. Small left ovarian cyst 2.2 x 1.6 cm image 62, decreased in size since previous exam. Stomach and bowel loops normal appearance. No mass, adenopathy, free fluid or inflammatory process. No acute osseous findings.  IMPRESSION: Small umbilical hernia containing fat. Otherwise negative exam.   Original Report Authenticated By: Ulyses Southward, M.D.      1. Abdominal pain   2. Bacterial vaginosis       MDM  patient with lower abdominal pain. Negative CT. Patient did not tell me initially but has been worked up a couple months ago for the same. Pelvic exam was done and showed only a few  trichomoniasis. Patient was treated with Flagyl. We'll follow Dr. with        Juliet Rude. Rubin Payor, MD 10/21/12 [phone]

## 2012-10-21 NOTE — ED Notes (Signed)
Discharge instructions reviewed. Rx given x1. Bus pass given x1. All questions answered

## 2012-10-22 LAB — GC/CHLAMYDIA PROBE AMP: GC Probe RNA: NEGATIVE

## 2012-11-13 ENCOUNTER — Other Ambulatory Visit (HOSPITAL_COMMUNITY)
Admission: RE | Admit: 2012-11-13 | Discharge: 2012-11-13 | Disposition: A | Discharge status: Home / Self Care | Payer: Self-pay | Source: Ambulatory Visit | Attending: Family | Admitting: Family

## 2012-11-13 ENCOUNTER — Ambulatory Visit (INDEPENDENT_AMBULATORY_CARE_PROVIDER_SITE_OTHER): Payer: Self-pay | Admitting: Family

## 2012-11-13 ENCOUNTER — Encounter: Payer: Self-pay | Admitting: Family

## 2012-11-13 VITALS — BP 120/76 | HR 80 | Temp 97.3°F | Wt 177.0 lb

## 2012-11-13 DIAGNOSIS — Z124 Encounter for screening for malignant neoplasm of cervix: Secondary | ICD-10-CM

## 2012-11-13 DIAGNOSIS — R1031 Right lower quadrant pain: Secondary | ICD-10-CM

## 2012-11-13 DIAGNOSIS — N760 Acute vaginitis: Secondary | ICD-10-CM | POA: Insufficient documentation

## 2012-11-13 DIAGNOSIS — Z01419 Encounter for gynecological examination (general) (routine) without abnormal findings: Secondary | ICD-10-CM | POA: Insufficient documentation

## 2012-11-13 MED ORDER — HYOSCYAMINE SULFATE ER 0.375 MG PO TB12: 0.3750 mg | ORAL_TABLET | Freq: Two times a day (BID) | ORAL | Status: DC | PRN

## 2012-11-13 NOTE — Patient Instructions (Signed)
Irritable Bowel Syndrome  Irritable Bowel Syndrome (IBS) is caused by a disturbance of normal bowel function. Other terms used are spastic colon, mucous colitis, and irritable colon. It does not require surgery, nor does it lead to cancer. There is no cure for IBS. But with proper diet, stress reduction, and medication, you will find that your problems (symptoms) will gradually disappear or improve. IBS is a common digestive disorder. It usually appears in late adolescence or early adulthood. Women develop it twice as often as men.  CAUSES   After food has been digested and absorbed in the small intestine, waste material is moved into the colon (large intestine). In the colon, water and salts are absorbed from the undigested products coming from the small intestine. The remaining residue, or fecal material, is held for elimination. Under normal circumstances, gentle, rhythmic contractions on the bowel walls push the fecal material along the colon towards the rectum. In IBS, however, these contractions are irregular and poorly coordinated. The fecal material is either retained too long, resulting in constipation, or expelled too soon, producing diarrhea.  SYMPTOMS   The most common symptom of IBS is pain. It is typically in the lower left side of the belly (abdomen). But it may occur anywhere in the abdomen. It can be felt as heartburn, backache, or even as a dull pain in the arms or shoulders. The pain comes from excessive bowel-muscle spasms and from the buildup of gas and fecal material in the colon. This pain:   Can range from sharp belly (abdominal) cramps to a dull, continuous ache.   Usually worsens soon after eating.   Is typically relieved by having a bowel movement or passing gas.  Abdominal pain is usually accompanied by constipation. But it may also produce diarrhea. The diarrhea typically occurs right after a meal or upon arising in the morning. The stools are typically soft and watery. They are often  flecked with secretions (mucus).  Other symptoms of IBS include:   Bloating.   Loss of appetite.   Heartburn.   Feeling sick to your stomach (nausea).   Belching   Vomiting   Gas.  IBS may also cause a number of symptoms that are unrelated to the digestive system:   Fatigue.   Headaches.   Anxiety   Shortness of breath   Difficulty in concentrating.   Dizziness.  These symptoms tend to come and go.  DIAGNOSIS   The symptoms of IBS closely mimic the symptoms of other, more serious digestive disorders. So your caregiver may wish to perform a variety of additional tests to exclude these disorders. He/she wants to be certain of learning what is wrong (diagnosis). The nature and purpose of each test will be explained to you.  TREATMENT  A number of medications are available to help correct bowel function and/or relieve bowel spasms and abdominal pain. Among the drugs available are:   Mild, non-irritating laxatives for severe constipation and to help restore normal bowel habits.   Specific anti-diarrheal medications to treat severe or prolonged diarrhea.   Anti-spasmodic agents to relieve intestinal cramps.   Your caregiver may also decide to treat you with a mild tranquilizer or sedative during unusually stressful periods in your life.  The important thing to remember is that if any drug is prescribed for you, make sure that you take it exactly as directed. Make sure that your caregiver knows how well it worked for you.  HOME CARE INSTRUCTIONS    Avoid foods that   are high in fat or oils. Some examples are:heavy cream, butter, frankfurters, sausage, and other fatty meats.   Avoid foods that have a laxative effect, such as fruit, fruit juice, and dairy products.   Cut out carbonated drinks, chewing gum, and "gassy" foods, such as beans and cabbage. This may help relieve bloating and belching.   Bran taken with plenty of liquids may help relieve constipation.   Keep track of what foods seem to trigger  your symptoms.   Avoid emotionally charged situations or circumstances that produce anxiety.   Start or continue exercising.   Get plenty of rest and sleep.  MAKE SURE YOU:    Understand these instructions.   Will watch your condition.   Will get help right away if you are not doing well or get worse.  Document Released: 10/04/2005 Document Revised: 12/27/2011 Document Reviewed: 05/24/2008  [employer] Patient Information 2013 [employer]

## 2012-11-13 NOTE — Progress Notes (Signed)
Subjective:    Patient ID: Sarah Leach, female    DOB: 09/20/1981, 32 y.o.   MRN: [id number]  HPI 32 year old white female, nonsmoker is in with persistent right lower quadrant abdominal pain. She was seen in the emergency department in October for similar pain had a CT scan of the abdomen and pelvis that was negative. Most recently, she was seen in the emergency department on 10/21/2012 and a repeat CT scan that continued to be negative. She was diagnosed with bacterial vaginosis and treated. All other tests were negative. She continues to have persistent, dull, right lower quadrant pain that's worse with eating. She describes it as a 6/10 but characterized as more sharp after eating or after sitting for extended periods of time. She denies any nausea, vomiting, diarrhea, constipation, blood in her stools or foul-smelling stools. Reports a decrease in her appetite some fluid she doesn't want he because she knows that she'll have abdominal pain thereafter. She has about 2 bowel movements per day that are soft and formed. No difficulty defecating. Last Pap smear-unknown. Urine pregnancy test-negative. Resolving small ovarian cyst noted on ultrasound from earlier this month but it's on the left side.   Review of Systems  Constitutional: Positive for activity change.  HENT: Negative.   Respiratory: Negative.   Cardiovascular: Negative.   Gastrointestinal: Positive for abdominal pain. Negative for nausea, diarrhea, constipation and blood in stool.       Right lower quadrant  Genitourinary: Negative.   Musculoskeletal: Negative.   Skin: Negative.   Neurological: Negative.   Psychiatric/Behavioral: Negative.    No past medical history on file.  History   Social History  . Marital Status: Married    Spouse Name: N/A    Number of Children: N/A  . Years of Education: N/A   Occupational History  . Not on file.   Social History Main Topics  . Smoking status: Never Smoker   . Smokeless  tobacco: Never Used  . Alcohol Use: No  . Drug Use: No  . Sexually Active: Yes    Birth Control/ Protection: None   Other Topics Concern  . Not on file   Social History Narrative  . No narrative on file    Past Surgical History  Procedure Date  . Tubal ligation 2010    Family History  Problem Relation Age of Onset  . Arthritis Maternal Grandmother   . Hearing loss Maternal Grandmother   . Arthritis Maternal Grandfather   . Hearing loss Maternal Grandfather     No Known Allergies  Current Outpatient Prescriptions on File Prior to Visit  Medication Sig Dispense Refill  . hyoscyamine (LEVBID) 0.375 MG 12 hr tablet Take 1 tablet (0.375 mg total) by mouth every 12 (twelve) hours as needed for cramping.  60 tablet  0  . metroNIDAZOLE (FLAGYL) 500 MG tablet Take 1 tablet (500 mg total) by mouth 2 (two) times daily.  14 tablet  0    BP 120/76  Pulse 80  Temp 97.3 F (36.3 C) (Oral)  Wt 177 lb (80.287 kg)  SpO2 98%  LMP 12/28/2013chart    Objective:   Physical Exam  Constitutional: She is oriented to person, place, and time. She appears well-developed and well-nourished.  HENT:  Right Ear: External ear normal.  Left Ear: External ear normal.  Nose: Nose normal.  Mouth/Throat: Oropharynx is clear and moist.  Neck: Normal range of motion.  Cardiovascular: Normal rate, regular rhythm and normal heart sounds.   Pulmonary/Chest: Effort normal  and breath sounds normal.  Abdominal: Soft. Bowel sounds are normal. There is tenderness. There is no rebound and no guarding.       Moderate tenderness noted to the right lower quadrant  Genitourinary: Vagina normal and uterus normal. Guaiac negative stool. No vaginal discharge found.  Musculoskeletal: Normal range of motion.  Neurological: She is alert and oriented to person, place, and time.  Skin: Skin is warm and dry.  Psychiatric: She has a normal mood and affect.          Assessment & Plan:  Assessment:  1. Right  lower quadrant pain 2. Irritable bowel syndrome  3. Screening for malignancy of the cervix  Plan: Pap smear sent. Will start Levbid twice a day as needed for pain. Refer to GI. Patient to followup here with any questions or concerns. Otherwise we'll see what GI has to stand up from there.

## 2012-11-14 ENCOUNTER — Encounter: Payer: Self-pay | Admitting: Internal Medicine

## 2012-11-18 ENCOUNTER — Emergency Department (HOSPITAL_COMMUNITY): Payer: Self-pay

## 2012-11-18 ENCOUNTER — Emergency Department (HOSPITAL_COMMUNITY)
Admission: EM | Admit: 2012-11-18 | Discharge: 2012-11-18 | Disposition: A | Discharge status: Home / Self Care | Payer: Self-pay | Attending: Emergency Medicine | Admitting: Emergency Medicine

## 2012-11-18 ENCOUNTER — Encounter (HOSPITAL_COMMUNITY): Payer: Self-pay | Admitting: Emergency Medicine

## 2012-11-18 DIAGNOSIS — Z9851 Tubal ligation status: Secondary | ICD-10-CM | POA: Insufficient documentation

## 2012-11-18 DIAGNOSIS — N83209 Unspecified ovarian cyst, unspecified side: Secondary | ICD-10-CM | POA: Insufficient documentation

## 2012-11-18 DIAGNOSIS — B9689 Other specified bacterial agents as the cause of diseases classified elsewhere: Secondary | ICD-10-CM

## 2012-11-18 DIAGNOSIS — N898 Other specified noninflammatory disorders of vagina: Secondary | ICD-10-CM | POA: Insufficient documentation

## 2012-11-18 DIAGNOSIS — R1031 Right lower quadrant pain: Secondary | ICD-10-CM | POA: Insufficient documentation

## 2012-11-18 DIAGNOSIS — G8929 Other chronic pain: Secondary | ICD-10-CM | POA: Insufficient documentation

## 2012-11-18 DIAGNOSIS — Z3202 Encounter for pregnancy test, result negative: Secondary | ICD-10-CM | POA: Insufficient documentation

## 2012-11-18 DIAGNOSIS — N760 Acute vaginitis: Secondary | ICD-10-CM | POA: Insufficient documentation

## 2012-11-18 DIAGNOSIS — R109 Unspecified abdominal pain: Secondary | ICD-10-CM

## 2012-11-18 LAB — CBC WITH DIFFERENTIAL/PLATELET
Basophils Absolute: 0 10*3/uL (ref 0.0–0.1)
Lymphocytes Relative: 25 % (ref 12–46)
Lymphs Abs: 1.8 10*3/uL (ref 0.7–4.0)
Neutro Abs: 4.9 10*3/uL (ref 1.7–7.7)
Neutrophils Relative %: 66 % (ref 43–77)
Platelets: 321 10*3/uL (ref 150–400)
RBC: 4.17 MIL/uL (ref 3.87–5.11)
RDW: 13.7 % (ref 11.5–15.5)
WBC: 7.3 10*3/uL (ref 4.0–10.5)

## 2012-11-18 LAB — URINALYSIS, ROUTINE W REFLEX MICROSCOPIC
Bilirubin Urine: NEGATIVE
Nitrite: NEGATIVE
Specific Gravity, Urine: 1.027 (ref 1.005–1.030)
pH: 5.5 (ref 5.0–8.0)

## 2012-11-18 LAB — URINE MICROSCOPIC-ADD ON

## 2012-11-18 LAB — PREGNANCY, URINE: Preg Test, Ur: NEGATIVE

## 2012-11-18 LAB — COMPREHENSIVE METABOLIC PANEL
ALT: 7 U/L (ref 0–35)
AST: 14 U/L (ref 0–37)
Alkaline Phosphatase: 59 U/L (ref 39–117)
CO2: 25 mEq/L (ref 19–32)
GFR calc Af Amer: >90 mL/min (ref >90)
GFR calc non Af Amer: 84 mL/min — ABNORMAL LOW (ref >90)
Glucose, Bld: 91 mg/dL (ref 70–99)
Potassium: 3.6 mEq/L (ref 3.5–5.1)
Sodium: 139 mEq/L (ref 135–145)
Total Protein: 7.1 g/dL (ref 6.0–8.3)

## 2012-11-18 LAB — WET PREP, GENITAL: Yeast Wet Prep HPF POC: NONE SEEN

## 2012-11-18 MED ORDER — CEPHALEXIN 500 MG PO CAPS: 500.0000 mg | ORAL_CAPSULE | Freq: Four times a day (QID) | ORAL | Status: DC

## 2012-11-18 MED ORDER — METRONIDAZOLE 500 MG PO TABS
2000.0000 mg | ORAL_TABLET | Freq: Once | ORAL | Status: AC
  Administered 2012-11-18: 2000 mg via ORAL
  Filled 2012-11-18: qty 4

## 2012-11-18 NOTE — ED Notes (Signed)
Pt denies v/d; pt c/o RLQ abdominal pain x 2months; pt denies numbness/tingling; pt denies dizziness/lightheadedness; pt denies chest pain; pt denies difficulty breathing; pt alert and mentating appropriately.

## 2012-11-18 NOTE — ED Provider Notes (Signed)
History     CSN: [id number]  Arrival date & time 11/18/12  1215   First MD Initiated Contact with Patient 11/18/12 1226      Chief Complaint  Patient presents with  . Abdominal Pain    (Consider location/radiation/quality/duration/timing/severity/associated sxs/prior treatment) HPI Comments: Patient presents with right-sided lower abdominal pain since October. This pain is constant  and persistent. She states it goes away for a few minutes at a time and then comes back. She's had previous evaluations including 2 negative CT scans and refer to GI which she states she cannot afford. She denies any nausea or vomiting or change in bowel habits. No fevers, dysuria. Nothing makes the pain better or worse. She came in today because she is tired of the pain it is aggravating her. She's not take anything at home for pain.  The history is provided by the patient.    History reviewed. No pertinent past medical history.  Past Surgical History  Procedure Date  . Tubal ligation 2010    Family History  Problem Relation Age of Onset  . Arthritis Maternal Grandmother   . Hearing loss Maternal Grandmother   . Arthritis Maternal Grandfather   . Hearing loss Maternal Grandfather     History  Substance Use Topics  . Smoking status: Never Smoker   . Smokeless tobacco: Never Used  . Alcohol Use: No    OB History    Grav Para Term Preterm Abortions TAB SAB Ect Mult Living                  Review of Systems  Constitutional: Negative for fever, activity change and appetite change.  HENT: Negative for congestion, sore throat and rhinorrhea.   Respiratory: Negative for cough, chest tightness and shortness of breath.   Cardiovascular: Negative for chest pain.  Gastrointestinal: Positive for abdominal pain. Negative for nausea, vomiting and diarrhea.  Genitourinary: Positive for vaginal discharge. Negative for dysuria.  Musculoskeletal: Negative for back pain.  Skin: Negative for rash.   Neurological: Negative for dizziness, weakness and headaches.    Allergies  Review of patient's allergies indicates no known allergies.  Home Medications  No current outpatient prescriptions on file.  BP 121/66  Pulse 62  Temp 97.8 F (36.6 C) (Oral)  Resp 16  SpO2 100%  LMP 10/14/2012  Physical Exam  Constitutional: She is oriented to person, place, and time. She appears well-developed and well-nourished. No distress.       Appears well, no distress  HENT:  Head: Normocephalic and atraumatic.  Mouth/Throat: Oropharynx is clear and moist. No oropharyngeal exudate.  Eyes: Conjunctivae normal and EOM are normal. Pupils are equal, round, and reactive to light.  Neck: Normal range of motion. Neck supple.  Cardiovascular: Normal rate, regular rhythm and normal heart sounds.   No murmur heard. Pulmonary/Chest: Effort normal. No respiratory distress.  Abdominal: There is tenderness. There is guarding.       TTP RLQ with palpation and voluntary guarding.  Genitourinary: Cervix exhibits discharge. Cervix exhibits no motion tenderness. Right adnexum displays tenderness. Right adnexum displays no mass. Left adnexum displays no mass and no tenderness. Vaginal discharge found.  Musculoskeletal: Normal range of motion. She exhibits no edema and no tenderness.       No CVAT  Neurological: She is alert and oriented to person, place, and time. No cranial nerve deficit. She exhibits normal muscle tone. Coordination normal.  Skin: Skin is warm.    ED Course  Procedures (including critical  care time)  Labs Reviewed  URINALYSIS, ROUTINE W REFLEX MICROSCOPIC - Abnormal; Notable for the following:    APPearance TURBID (*)     Leukocytes, UA MODERATE (*)     All other components within normal limits  CBC WITH DIFFERENTIAL - Abnormal; Notable for the following:    HCT 35.3 (*)     All other components within normal limits  COMPREHENSIVE METABOLIC PANEL - Abnormal; Notable for the following:     GFR calc non Af Amer 84 (*)     All other components within normal limits  WET PREP, GENITAL - Abnormal; Notable for the following:    Clue Cells Wet Prep HPF POC FEW (*)     WBC, Wet Prep HPF POC FEW (*)     All other components within normal limits  URINE MICROSCOPIC-ADD ON - Abnormal; Notable for the following:    Squamous Epithelial / LPF MANY (*)     All other components within normal limits  PREGNANCY, URINE  LIPASE, BLOOD  GC/CHLAMYDIA PROBE AMP   US Transvaginal Non-ob  11/18/2012  *RADIOLOGY REPORT*  Clinical Data:  Right adnexal pain, evaluate for torsion  TRANSABDOMINAL AND TRANSVAGINAL ULTRASOUND OF PELVIS DOPPLER ULTRASOUND OF OVARIES  Technique:  Both transabdominal and transvaginal ultrasound examinations of the pelvis were performed. Transabdominal technique was performed for global imaging of the pelvis including uterus, ovaries, adnexal regions, and pelvic cul-de-sac.  It was necessary to proceed with endovaginal exam following the transabdominal exam to visualize the endometrium.  Color and duplex Doppler ultrasound was utilized to evaluate blood flow to the ovaries.  Comparison:  None.  Findings:  Uterus:  Normal in size and appearance, measuring 7.9 x 3.9 x 5.6 cm.  Endometrium:  Normal in thickness and appearance, measuring 10 mm.  Right ovary: Normal appearance/no adnexal mass, measuring 4.1 x 2.1 x 2.4 cm, and is notable for a 1.6 cm corpus luteal cyst.  Left ovary:   Normal appearance/no adnexal mass, measuring 2.5 x 1.4 x 2.1 cm.  Pulsed Doppler evaluation demonstrates normal low-resistance arterial and venous waveforms in both ovaries.  IMPRESSION: Normal exam.  No evidence of pelvic mass or other significant abnormality.  No sonographic evidence for ovarian torsion.  1.6 cm right corpus luteal cyst.   Original Report Authenticated By: Charline Bills, M.D.    US Pelvis Complete  11/18/2012  *RADIOLOGY REPORT*  Clinical Data:  Right adnexal pain, evaluate for torsion   TRANSABDOMINAL AND TRANSVAGINAL ULTRASOUND OF PELVIS DOPPLER ULTRASOUND OF OVARIES  Technique:  Both transabdominal and transvaginal ultrasound examinations of the pelvis were performed. Transabdominal technique was performed for global imaging of the pelvis including uterus, ovaries, adnexal regions, and pelvic cul-de-sac.  It was necessary to proceed with endovaginal exam following the transabdominal exam to visualize the endometrium.  Color and duplex Doppler ultrasound was utilized to evaluate blood flow to the ovaries.  Comparison:  None.  Findings:  Uterus:  Normal in size and appearance, measuring 7.9 x 3.9 x 5.6 cm.  Endometrium:  Normal in thickness and appearance, measuring 10 mm.  Right ovary: Normal appearance/no adnexal mass, measuring 4.1 x 2.1 x 2.4 cm, and is notable for a 1.6 cm corpus luteal cyst.  Left ovary:   Normal appearance/no adnexal mass, measuring 2.5 x 1.4 x 2.1 cm.  Pulsed Doppler evaluation demonstrates normal low-resistance arterial and venous waveforms in both ovaries.  IMPRESSION: Normal exam.  No evidence of pelvic mass or other significant abnormality.  No sonographic evidence for ovarian  torsion.  1.6 cm right corpus luteal cyst.   Original Report Authenticated By: Charline Bills, M.D.    [place] Art/ven Flow Abd Pelv Doppler  11/18/2012  *RADIOLOGY REPORT*  Clinical Data:  Right adnexal pain, evaluate for torsion  TRANSABDOMINAL AND TRANSVAGINAL ULTRASOUND OF PELVIS DOPPLER ULTRASOUND OF OVARIES  Technique:  Both transabdominal and transvaginal ultrasound examinations of the pelvis were performed. Transabdominal technique was performed for global imaging of the pelvis including uterus, ovaries, adnexal regions, and pelvic cul-de-sac.  It was necessary to proceed with endovaginal exam following the transabdominal exam to visualize the endometrium.  Color and duplex Doppler ultrasound was utilized to evaluate blood flow to the ovaries.  Comparison:  None.  Findings:  Uterus:  Normal  in size and appearance, measuring 7.9 x 3.9 x 5.6 cm.  Endometrium:  Normal in thickness and appearance, measuring 10 mm.  Right ovary: Normal appearance/no adnexal mass, measuring 4.1 x 2.1 x 2.4 cm, and is notable for a 1.6 cm corpus luteal cyst.  Left ovary:   Normal appearance/no adnexal mass, measuring 2.5 x 1.4 x 2.1 cm.  Pulsed Doppler evaluation demonstrates normal low-resistance arterial and venous waveforms in both ovaries.  IMPRESSION: Normal exam.  No evidence of pelvic mass or other significant abnormality.  No sonographic evidence for ovarian torsion.  1.6 cm right corpus luteal cyst.   Original Report Authenticated By: Charline Bills, M.D.      No diagnosis found.    MDM  Acute on chronic abdominal pain x 4 months.  No recent change.  No vomiting, change in bowel habits or fever.  Patient has had right lower quadrant pain since October with 2 negative CTs. She states the pain today is unchanged from previous. Do not suspect appendicitis. Do not think further CT imaging would be beneficial. Discussed with patient that I think it is unlikely that she has appendicitis given the chronic nature of this pain. She agrees and does not want to pursue additional CTs imaging either.  Blood work unremarkable. Questionable UTI. HCG negative. She does have right adnexal pain on pelvic exam. We'll treat bacterial vaginosis. Right corpus luteum cyst seen which is likely causing patient's pain.      Glynn Octave, MD 11/18/12 [phone]

## 2012-11-18 NOTE — ED Notes (Signed)
Pt ambulatory leaving ED with husband; pt given d/c teaching and prescription for antibiotic; pt verbalizes understanding of d/c teaching; pt educated on importance of follow-up care and s/s for return to ED; pt has no further questions upon d/c. Pt does not appear to be in acute distress upon d/c.

## 2012-11-18 NOTE — ED Notes (Signed)
Pt. Stated, I've had rt. Side pain for a couple of months.  I've been to [facility] long, and to my Dr. At [facility] .  Stated, the Dr. York Spaniel I need a colonoscopy but I can't afford it.

## 2012-11-19 LAB — GC/CHLAMYDIA PROBE AMP: GC Probe RNA: NEGATIVE

## 2012-11-20 ENCOUNTER — Other Ambulatory Visit: Payer: Self-pay | Admitting: Family

## 2012-11-20 MED ORDER — FLUCONAZOLE 150 MG PO TABS: 150.0000 mg | ORAL_TABLET | Freq: Once | ORAL | Status: DC

## 2012-11-20 MED ORDER — METRONIDAZOLE 0.75 % VA GEL: 1.0000 | Freq: Every day | VAGINAL | Status: AC

## 2012-11-24 ENCOUNTER — Encounter: Payer: Self-pay | Admitting: Internal Medicine

## 2012-11-30 ENCOUNTER — Ambulatory Visit: Payer: Self-pay | Admitting: Internal Medicine

## 2012-12-02 ENCOUNTER — Other Ambulatory Visit: Payer: Self-pay

## 2013-03-13 ENCOUNTER — Ambulatory Visit: Payer: Self-pay | Admitting: Family

## 2013-03-14 ENCOUNTER — Ambulatory Visit: Payer: Self-pay | Admitting: Family

## 2013-03-26 ENCOUNTER — Encounter: Payer: Self-pay | Admitting: Family

## 2013-03-26 ENCOUNTER — Ambulatory Visit (INDEPENDENT_AMBULATORY_CARE_PROVIDER_SITE_OTHER): Payer: Self-pay | Admitting: Family

## 2013-03-26 VITALS — BP 100/80 | HR 72 | Wt 182.0 lb

## 2013-03-26 DIAGNOSIS — M25562 Pain in left knee: Secondary | ICD-10-CM

## 2013-03-26 DIAGNOSIS — M1711 Unilateral primary osteoarthritis, right knee: Secondary | ICD-10-CM

## 2013-03-26 DIAGNOSIS — M25561 Pain in right knee: Secondary | ICD-10-CM

## 2013-03-26 DIAGNOSIS — M25569 Pain in unspecified knee: Secondary | ICD-10-CM

## 2013-03-26 DIAGNOSIS — E669 Obesity, unspecified: Secondary | ICD-10-CM

## 2013-03-26 DIAGNOSIS — M1712 Unilateral primary osteoarthritis, left knee: Secondary | ICD-10-CM

## 2013-03-26 DIAGNOSIS — M1710 Unilateral primary osteoarthritis, unspecified knee: Secondary | ICD-10-CM

## 2013-03-26 MED ORDER — DICLOFENAC SODIUM 75 MG PO TBEC: 75.0000 mg | DELAYED_RELEASE_TABLET | Freq: Two times a day (BID) | ORAL | Status: DC

## 2013-03-26 NOTE — Patient Instructions (Signed)
Osteoarthritis Osteoarthritis is the most common form of arthritis. It is redness, soreness, and swelling (inflammation) affecting the cartilage. Cartilage acts as a cushion, covering the ends of bones where they meet to form a joint. CAUSES  Over time, the cartilage begins to wear away. This causes bone to rub on bone. This produces pain and stiffness in the affected joints. Factors that contribute to this problem are:  Excessive body weight.  Age.  Overuse of joints. SYMPTOMS   People with osteoarthritis usually experience joint pain, swelling, or stiffness.  Over time, the joint may lose its normal shape.  Small deposits of bone (osteophytes) may grow on the edges of the joint.  Bits of bone or cartilage can break off and float inside the joint space. This may cause more pain and damage.  Osteoarthritis can lead to depression, anxiety, feelings of helplessness, and limitations on daily activities. The most commonly affected joints are in the:  Ends of the fingers.  Thumbs.  Neck.  Lower back.  Knees.  Hips. DIAGNOSIS  Diagnosis is mostly based on your symptoms and exam. Tests may be helpful, including:  X-rays of the affected joint.  A computerized magnetic scan (MRI).  Blood tests to rule out other types of arthritis.  Joint fluid tests. This involves using a needle to draw fluid from the joint and examining the fluid under a microscope. TREATMENT  Goals of treatment are to control pain, improve joint function, maintain a normal body weight, and maintain a healthy lifestyle. Treatment approaches may include:  A prescribed exercise program with rest and joint relief.  Weight control with nutritional education.  Pain relief techniques such as:  Properly applied heat and cold.  Electric pulses delivered to nerve endings under the skin (transcutaneous electrical nerve stimulation, TENS).  Massage.  Certain supplements. Ask your caregiver before using any  supplements, especially in combination with prescribed drugs.  Medicines to control pain, such as:  Acetaminophen.  Nonsteroidal anti-inflammatory drugs (NSAIDs), such as naproxen.  Narcotic or central-acting agents, such as tramadol. This drug carries a risk of addiction and is generally prescribed for short-term use.  Corticosteroids. These can be given orally or as injection. This is a short-term treatment, not recommended for routine use.  Surgery to reposition the bones and relieve pain (osteotomy) or to remove loose pieces of bone and cartilage. Joint replacement may be needed in advanced states of osteoarthritis. HOME CARE INSTRUCTIONS  Your caregiver can recommend specific types of exercise. These may include:  Strengthening exercises. These are done to strengthen the muscles that support joints affected by arthritis. They can be performed with weights or with exercise bands to add resistance.  Aerobic activities. These are exercises, such as brisk walking or low-impact aerobics, that get your heart pumping. They can help keep your lungs and circulatory system in shape.  Range-of-motion activities. These keep your joints limber.  Balance and agility exercises. These help you maintain daily living skills. Learning about your condition and being actively involved in your care will help improve the course of your osteoarthritis. SEEK MEDICAL CARE IF:   You feel hot or your skin turns red.  You develop a rash in addition to your joint pain.  You have an oral temperature above 102 F (38.9 C). FOR MORE INFORMATION  National Institute of Arthritis and Musculoskeletal and Skin Diseases: www.niams.nih.gov National Institute on Aging: www.nia.nih.gov American College of Rheumatology: www.rheumatology.org Document Released: 10/04/2005 Document Revised: 12/27/2011 Document Reviewed: 01/15/2010 ExitCare Patient Information 2014 [employer].  

## 2013-03-26 NOTE — Progress Notes (Signed)
  Subjective:    Patient ID: Sarah Leach, female    DOB: 1981/09/16, 32 y.o.   MRN: [id number]  HPI 32 year old white female, nonsmoker, obese, is in with complaints of bilateral knee pain that's been ongoing for several months. The pain tends to be worse with sitting for an extended period of time. She reports the pain a 9/10. She's been taking Aleve 3 times a day that does help her symptoms but it continues to persist. Denies any family history of any muscle or bone issues.   Review of Systems  Constitutional: Negative.   Respiratory: Negative.   Cardiovascular: Negative.   Gastrointestinal: Negative.   Musculoskeletal: Negative for myalgias and joint swelling.       Bilateral knee pain  Skin: Negative.   Neurological: Negative.   Hematological: Negative.   Psychiatric/Behavioral: Negative.    No past medical history on file.  History   Social History  . Marital Status: Married    Spouse Name: N/A    Number of Children: N/A  . Years of Education: N/A   Occupational History  . Not on file.   Social History Main Topics  . Smoking status: Never Smoker   . Smokeless tobacco: Never Used  . Alcohol Use: No  . Drug Use: No  . Sexually Active: Yes    Birth Control/ Protection: None   Other Topics Concern  . Not on file   Social History Narrative  . No narrative on file    Past Surgical History  Procedure Laterality Date  . Tubal ligation  2010    Family History  Problem Relation Age of Onset  . Arthritis Maternal Grandmother   . Hearing loss Maternal Grandmother   . Arthritis Maternal Grandfather   . Hearing loss Maternal Grandfather     No Known Allergies  Current Outpatient Prescriptions on File Prior to Visit  Medication Sig Dispense Refill  . cephALEXin (KEFLEX) 500 MG capsule Take 1 capsule (500 mg total) by mouth 4 (four) times daily.  40 capsule  0  . fluconazole (DIFLUCAN) 150 MG tablet Take 1 tablet (150 mg total) by mouth once.  1 tablet  0   No  current facility-administered medications on file prior to visit.    BP 100/80  Pulse 72  Wt 182 lb (82.555 kg)  BMI 30.07 kg/m2  SpO2 97%chart    Objective:   Physical Exam  Constitutional: She is oriented to person, place, and time. She appears well-developed and well-nourished.  Neck: Normal range of motion. Neck supple.  Cardiovascular: Normal rate, regular rhythm and normal heart sounds.   Pulmonary/Chest: Effort normal and breath sounds normal.  Musculoskeletal:  Pain with flexion and extension of the knees bilateral. Positive crepitus  Neurological: She is alert and oriented to person, place, and time. She has normal reflexes.  Skin: Skin is dry.  Psychiatric: She has a normal mood and affect.          Assessment & Plan:  Assessment:  1. Bilateral knee pain 2. Osteoarthritis 3. Obesity  Plan: Voltaren 75mg  twice a day with food. Call the office if symptoms worsen or persist. Recheck as scheduled and as needed. Xray of the knees bilaterally.

## 2013-03-27 ENCOUNTER — Ambulatory Visit (INDEPENDENT_AMBULATORY_CARE_PROVIDER_SITE_OTHER)
Admission: RE | Admit: 2013-03-27 | Discharge: 2013-03-27 | Disposition: A | Discharge status: Home / Self Care | Payer: Self-pay | Source: Ambulatory Visit | Attending: Family | Admitting: Family

## 2013-03-27 DIAGNOSIS — M25569 Pain in unspecified knee: Secondary | ICD-10-CM

## 2013-03-27 DIAGNOSIS — M25561 Pain in right knee: Secondary | ICD-10-CM

## 2013-03-27 DIAGNOSIS — M25562 Pain in left knee: Secondary | ICD-10-CM

## 2013-03-28 ENCOUNTER — Telehealth: Payer: Self-pay | Admitting: Family

## 2013-03-28 ENCOUNTER — Other Ambulatory Visit: Payer: Self-pay | Admitting: Family

## 2013-03-28 DIAGNOSIS — M25562 Pain in left knee: Secondary | ICD-10-CM

## 2013-03-28 DIAGNOSIS — M25561 Pain in right knee: Secondary | ICD-10-CM

## 2013-03-28 NOTE — Telephone Encounter (Signed)
See results note. 

## 2013-03-28 NOTE — Telephone Encounter (Signed)
Pt anxious to here about xray. Pls call when available.

## 2013-03-29 ENCOUNTER — Telehealth: Payer: Self-pay | Admitting: Family

## 2013-03-29 MED ORDER — TRAMADOL HCL 50 MG PO TABS: 50.0000 mg | ORAL_TABLET | Freq: Three times a day (TID) | ORAL | Status: DC | PRN

## 2013-03-29 NOTE — Telephone Encounter (Signed)
Pls advise.  

## 2013-03-29 NOTE — Telephone Encounter (Signed)
Called and spoke with pt and pt is aware.  

## 2013-03-29 NOTE — Telephone Encounter (Signed)
Pt would like pain medicine for her bone spurs in knees. Pt said the pain is getting worse. Pt said MD is aware. Pt wanted to inform you that her knees gave out last night and she fell.  Pt states she was not injured. Pharm:  [facility]/ [address]

## 2013-03-30 ENCOUNTER — Telehealth: Payer: Self-pay | Admitting: Family

## 2013-03-30 NOTE — Telephone Encounter (Signed)
PT states that she was referred to [facility] orth[facility], but is unable to follow through with an appt due to their requirement of 250.00$ for the first appt. The patient is a self pay patient, and is requesting information for another orthopedic that would accept a payment plan. Please assist.

## 2013-04-03 NOTE — Telephone Encounter (Signed)
I contacted [facility], [facility], [facility] and [facility], they all required $250 upfront they do not allow a payment plan. The only way that they do not require that money up front is if pt is seen int he ED first.

## 2013-04-04 NOTE — Telephone Encounter (Signed)
Pt aware and will try [name]'s suggestion

## 2013-04-04 NOTE — Telephone Encounter (Signed)
Per Oran Rein, have pt ry tramadol and volteran combination for 7-10 days then let us know how she's doing.  LMTCB

## 2013-04-11 ENCOUNTER — Telehealth: Payer: Self-pay | Admitting: Family

## 2013-04-11 NOTE — Telephone Encounter (Signed)
Patient Information:  Caller Name: Sarah Leach  Phone: [phone]  Patient: Sarah Leach, Sarah Leach  Gender: Female  DOB: Nov 03, 1980  Age: 32 Years  PCP: [facility])  Pregnant: No  Office Follow Up:  Does the office need to follow up with this patient?: No  Instructions For The Office: N/A  RN Note:  BTL.  Called regarding ongoing chronic knee pain.  Bilateral knee pain rated 10/10.  Tried Volteran BID and Tramadol hs without no improvement.  Was not able to see orthopod due to $250 cost for initial visit. Declined appointment for 04/11/13 due to no transportation; has to ride the bus to get to office.  Also said  she has to go to church tonight so can't come this afternoon to see NP.  Requested appointment for 04/12/13 after 1500. Transferred to office scheduler/Sarah Leach for future appointment snce caller cannot come in the morning 04/12/13 and Sarah Sayres, NP still has available appointments 04/12/13.  Patient scheduled by Sarah Leach for 04/13/13 at 1530 with Sarah Sayres, NP.   Symptoms  Reason For Call & Symptoms: Called to request stonger pain medication for bilateral knee spurs.  Reports no relief after using Volteran BID.  Using Tramalol  hs only  because it makes her sleepy.  Reviewed Health History In EMR: Yes  Reviewed Medications In EMR: Yes  Reviewed Allergies In EMR: N/A  Reviewed Surgeries / Procedures: Yes  Date of Onset of Symptoms: 01/16/2013  Treatments Tried: Sherrlyn Hock, Tramadol  Treatments Tried Worked: No OB / GYN:  LMP: 04/04/2013  Guideline(s) Used:  Leg Pain  Knee Pain  Disposition Per Guideline:   See Today in Office  Reason For Disposition Reached:   Severe pain (e.g., excruciating, unable to walk)  Advice Given:  N/A  Patient Will Follow Care Advice:  YES

## 2013-04-13 ENCOUNTER — Ambulatory Visit: Payer: Self-pay | Admitting: Family

## 2013-08-06 ENCOUNTER — Ambulatory Visit (INDEPENDENT_AMBULATORY_CARE_PROVIDER_SITE_OTHER): Payer: Self-pay | Admitting: Family

## 2013-08-06 ENCOUNTER — Encounter: Payer: Self-pay | Admitting: Family

## 2013-08-06 VITALS — BP 108/70 | HR 75 | Wt 176.0 lb

## 2013-08-06 DIAGNOSIS — R1031 Right lower quadrant pain: Secondary | ICD-10-CM

## 2013-08-06 DIAGNOSIS — K219 Gastro-esophageal reflux disease without esophagitis: Secondary | ICD-10-CM

## 2013-08-06 DIAGNOSIS — G8929 Other chronic pain: Secondary | ICD-10-CM

## 2013-08-06 LAB — CBC WITH DIFFERENTIAL/PLATELET
Basophils Relative: 0.5 % (ref 0.0–3.0)
Eosinophils Absolute: 0.1 10*3/uL (ref 0.0–0.7)
Eosinophils Relative: 1.4 % (ref 0.0–5.0)
Lymphocytes Relative: 22.6 % (ref 12.0–46.0)
Neutrophils Relative %: 67.5 % (ref 43.0–77.0)
RBC: 4.09 Mil/uL (ref 3.87–5.11)
WBC: 6.9 10*3/uL (ref 4.5–10.5)

## 2013-08-06 LAB — COMPREHENSIVE METABOLIC PANEL
Albumin: 4 g/dL (ref 3.5–5.2)
BUN: 15 mg/dL (ref 6–23)
Calcium: 9.3 mg/dL (ref 8.4–10.5)
Chloride: 108 mEq/L (ref 96–112)
Glucose, Bld: 87 mg/dL (ref 70–99)
Potassium: 4.6 mEq/L (ref 3.5–5.1)

## 2013-08-06 LAB — POCT URINALYSIS DIPSTICK
Bilirubin, UA: NEGATIVE
Blood, UA: NEGATIVE
Glucose, UA: NEGATIVE
Spec Grav, UA: 1.01
Urobilinogen, UA: 0.2

## 2013-08-06 MED ORDER — OMEPRAZOLE 40 MG PO CPDR: 40.0000 mg | DELAYED_RELEASE_CAPSULE | Freq: Every day | ORAL | Status: DC

## 2013-08-06 NOTE — Progress Notes (Signed)
Subjective:    Patient ID: Sarah Leach, female    DOB: 1980-12-01, 32 y.o.   MRN: [id number]  HPI 32 year old WF, nonsmoker is in today with c/o abdominal pain to the right lower quadrant x 2-3 weeks. She has a history of chronic abdominal pain and ovarian cyst. She reports the pain waxes and wanes. Can be as intense as a 9/10. Worse with ingestion of food. Nothing makes it any better. Denies and diarrhea or constipation. She is married in a monogamous relationship with no concerns of STDs.    Review of Systems  Constitutional: Negative.   HENT: Negative.   Respiratory: Negative.   Cardiovascular: Negative.   Gastrointestinal: Positive for abdominal pain. Negative for nausea, diarrhea, constipation and abdominal distention.  Endocrine: Negative.   Genitourinary: Negative.  Negative for vaginal discharge and vaginal pain.  Musculoskeletal: Negative.   Skin: Negative.   Neurological: Negative.   Hematological: Negative.   Psychiatric/Behavioral: Negative.    History reviewed. No pertinent past medical history.  History   Social History  . Marital Status: Married    Spouse Name: N/A    Number of Children: N/A  . Years of Education: N/A   Occupational History  . Not on file.   Social History Main Topics  . Smoking status: Never Smoker   . Smokeless tobacco: Never Used  . Alcohol Use: No  . Drug Use: No  . Sexual Activity: Yes    Birth Control/ Protection: None   Other Topics Concern  . Not on file   Social History Narrative  . No narrative on file    Past Surgical History  Procedure Laterality Date  . Tubal ligation  2010    Family History  Problem Relation Age of Onset  . Arthritis Maternal Grandmother   . Hearing loss Maternal Grandmother   . Arthritis Maternal Grandfather   . Hearing loss Maternal Grandfather     No Known Allergies  Current Outpatient Prescriptions on File Prior to Visit  Medication Sig Dispense Refill  . diclofenac (VOLTAREN) 75 MG  EC tablet Take 1 tablet (75 mg total) by mouth 2 (two) times daily.  60 tablet  3  . naproxen sodium (ANAPROX) 220 MG tablet Take 220 mg by mouth as needed.      . traMADol (ULTRAM) 50 MG tablet Take 1 tablet (50 mg total) by mouth every 8 (eight) hours as needed for pain.  30 tablet  0  . cephALEXin (KEFLEX) 500 MG capsule Take 1 capsule (500 mg total) by mouth 4 (four) times daily.  40 capsule  0  . fluconazole (DIFLUCAN) 150 MG tablet Take 1 tablet (150 mg total) by mouth once.  1 tablet  0   No current facility-administered medications on file prior to visit.    BP 108/70  Pulse 75  Wt 176 lb (79.833 kg)  BMI 29.08 kg/m2chart    Objective:   Physical Exam  Constitutional: She is oriented to person, place, and time. She appears well-developed and well-nourished.  HENT:  Right Ear: External ear normal.  Left Ear: External ear normal.  Nose: Nose normal.  Mouth/Throat: Oropharynx is clear and moist.  Neck: Normal range of motion. Neck supple.  Cardiovascular: Normal rate, regular rhythm and normal heart sounds.   Pulmonary/Chest: Effort normal and breath sounds normal.  Abdominal: Soft. Bowel sounds are normal. She exhibits no distension. There is no rebound.  Musculoskeletal: Normal range of motion.  Neurological: She is alert and oriented to person,  place, and time.  Skin: Skin is warm and dry.  Psychiatric: She has a normal mood and affect.          Assessment & Plan:  Assessment: 1. Right Lower Quadrant  2. GERD  Plan: Omeprazole 40mg  once a day. Labs sent. Consider transvaginal ultrasound if no better. Recheck in 3 weeks and sooner as needed

## 2013-08-06 NOTE — Patient Instructions (Signed)

## 2013-08-09 ENCOUNTER — Telehealth: Payer: Self-pay | Admitting: Family

## 2013-08-09 NOTE — Telephone Encounter (Signed)
Pt would like results of labs pls call

## 2013-08-10 NOTE — Telephone Encounter (Signed)
Left message to advise pt labs normal and were released to MyChart

## 2013-08-14 ENCOUNTER — Telehealth: Payer: Self-pay | Admitting: Family

## 2013-08-14 MED ORDER — AMOXICILLIN 500 MG PO CAPS: 500.0000 mg | ORAL_CAPSULE | Freq: Three times a day (TID) | ORAL | Status: DC

## 2013-08-14 NOTE — Telephone Encounter (Signed)
Pt  has tooth abcess. Pt unable to afford a dentist. Pt is requesting abx call into [facility] on [address]. Pt wants abx for 4 dollars

## 2013-08-14 NOTE — Telephone Encounter (Signed)
Please advise 

## 2013-08-14 NOTE — Telephone Encounter (Signed)
This time only!!! See dentist

## 2013-08-15 NOTE — Telephone Encounter (Signed)
Pt aware and verbalized understanding.  

## 2013-08-23 ENCOUNTER — Other Ambulatory Visit: Payer: Self-pay

## 2013-09-20 ENCOUNTER — Telehealth: Payer: Self-pay | Admitting: Family

## 2013-09-20 NOTE — Telephone Encounter (Signed)
Patient Information:  Caller Name: Tikia  Phone: [phone]  Patient: Sarah Leach, Sarah Leach  Gender: Female  DOB: [birth year]  Age: 32 Years  PCP: Adline Mango [facility])  Pregnant: No  Office Follow Up:  Does the office need to follow up with this patient?: Yes  Instructions For The Office: Patient will need assistance with appt. She requested Monday.  Please call for assistance. She wants to try home care. Please follow up for assistance  RN Note:  Patient will contact the office tomorrow for an appt. She was told no availability tomorrow.  Home care advice and call back parameters reviewed. Understnading expressed.  Symptoms  Reason For Call & Symptoms: Patient states she is having nose bleeds. Onset Tuesday 09/18/13. She describes at least three daily.  She is able to get them stop. she bleeds from the left nare.   No recent illness,  Last nose bleed was today 14:45 pm. not currently bleeding.  Reviewed Health History In EMR: Yes  Reviewed Medications In EMR: Yes  Reviewed Allergies In EMR: Yes  Reviewed Surgeries / Procedures: Yes  Date of Onset of Symptoms: 09/18/2013  Treatments Tried: pinch nose, ice pack  Treatments Tried Worked: Yes OB / GYN:  LMP: 09/09/2013  Guideline(s) Used:  Nosebleed  Disposition Per Guideline:   See Today in Office  Reason For Disposition Reached:   Bleeding recurs 3 or more times in 24 hours despite direct pressure  Advice Given:  Reassurance:  Nosebleeds are common.  It sounds like a routine nosebleed that we can treat at home.  You should be able to stop the bleeding if you use the correct technique.  Remember to Sit up and lean forward to keep the blood from running down the back of your throat.  Treating a Nosebleed - Pinch the Nostrils:  First blow the nose to clear out any large clots.  Sit up and lean forward (Reason: blood makes people choke if they lean backwards).  Gently squeeze the soft parts of the lower nose  (nostrils) together. Use your thumb and your index finger in a pinching manner. Do this for 15 minutes. Use a clock or watch to measure the time. Your goal is to apply continuous pressure to the bleeding point.  If the bleeding does not stop after 15 minutes of squeezing, move your point of pressure and do this again for another 15 minutes.  Treating a Nosebleed - Inserting a Gauze with Decongestant Nose Drops:  If applying pressure fails, insert a gauze wet with decongestant nose drops (or petroleum jelly). (Reason: the gauze helps to apply pressure and the nose drops shrink the blood vessels)  Then repeat the process of gently squeezing the lower nose for 10 minutes.  Example decongestant medication: Afrin (oxymetazoline) nasal spray is available over-the-counter and is a nasal decongestant.  Prevention:  Dry air in your house or workplace can increase the chance of nosebleeds occurring. If the air is dry, use a humidifier in your bedroom to keep the nose from drying out. You can also apply petroleum jelly to the center wall (septum) inside the nose twice daily to reduce cracking and to promote healing.  Bleeding can start again if you rub your nose or blow the nose too hard. Avoid touching your nose and nose picking. Avoid blowing the nose.  Do not take aspirin or other anti-inflammatory medications (e.g., ibuprofen, Advil, Motrin, Aleve), unless you have been instructed to by your physician.  Call Back If:  Nosebleed  lasts longer than 30 minutes with using direct pressure  Lightheadedness or weakness occurs  Nosebleeds become worse  You become worse.  RN Overrode Recommendation:  Make Appointment  Patient will need assistance with appt. She requested Monday.  Please call for assistance. She wants to try home care. Please follow up for assistance

## 2013-09-21 ENCOUNTER — Encounter: Payer: Self-pay | Admitting: *Deleted

## 2013-09-21 NOTE — Telephone Encounter (Signed)
Home care?? Is she requesting home health? She is not a candidate because she is not homebound.

## 2013-09-22 ENCOUNTER — Encounter (HOSPITAL_COMMUNITY): Payer: Self-pay | Admitting: Emergency Medicine

## 2013-09-22 ENCOUNTER — Emergency Department (HOSPITAL_COMMUNITY)
Admission: EM | Admit: 2013-09-22 | Discharge: 2013-09-22 | Disposition: A | Discharge status: Home / Self Care | Payer: Self-pay | Attending: Emergency Medicine | Admitting: Emergency Medicine

## 2013-09-22 DIAGNOSIS — Z791 Long term (current) use of non-steroidal anti-inflammatories (NSAID): Secondary | ICD-10-CM | POA: Insufficient documentation

## 2013-09-22 DIAGNOSIS — R040 Epistaxis: Secondary | ICD-10-CM | POA: Insufficient documentation

## 2013-09-22 DIAGNOSIS — Z79899 Other long term (current) drug therapy: Secondary | ICD-10-CM | POA: Insufficient documentation

## 2013-09-22 DIAGNOSIS — Z792 Long term (current) use of antibiotics: Secondary | ICD-10-CM | POA: Insufficient documentation

## 2013-09-22 LAB — POCT I-STAT, CHEM 8
BUN: 11 mg/dL (ref 6–23)
Calcium, Ion: 1.19 mmol/L (ref 1.12–1.23)
Chloride: 105 mEq/L (ref 96–112)
Potassium: 3.9 mEq/L (ref 3.5–5.1)

## 2013-09-22 LAB — CBC WITH DIFFERENTIAL/PLATELET
Basophils Absolute: 0 10*3/uL (ref 0.0–0.1)
HCT: 32.3 % — ABNORMAL LOW (ref 36.0–46.0)
Hemoglobin: 11 g/dL — ABNORMAL LOW (ref 12.0–15.0)
Lymphocytes Relative: 27 % (ref 12–46)
Monocytes Absolute: 0.4 10*3/uL (ref 0.1–1.0)
Neutro Abs: 4.4 10*3/uL (ref 1.7–7.7)
RDW: 13.3 % (ref 11.5–15.5)
WBC: 6.8 10*3/uL (ref 4.0–10.5)

## 2013-09-22 LAB — PROTIME-INR: INR: 0.95 (ref 0.00–1.49)

## 2013-09-22 NOTE — ED Provider Notes (Signed)
CSN: [id number]     Arrival date & time 09/22/13  1037 History   First MD Initiated Contact with Patient 09/22/13 1116     Chief Complaint  Patient presents with  . Epistaxis   (Consider location/radiation/quality/duration/timing/severity/associated sxs/prior Treatment) HPI  32 year old female presents for evaluations of recurrent nosebleed.  Patient reports for the past week she has had intermittent nosebleed usually lasting less than 30 minutes.Usually in the morning. Bleeding is usually on the left nares but last night she has 3 separate episodes  to both nares. She denies any associated pain. She denies any recent trauma. She denies being on any blood thinner medication or having prior history of recurrent nosebleeds. She has a heating a condition window unit where she has always had in the past. She has no prior history of coagulopathy. She is not actively bleeding but would like to find out the cause of nosebleed. She denies having any other abnormal bleeding, no gum bleeding when brushing teeth. bleeding usually does not drain to the back of her throat or affecting her breathing.  Patient does not use any medication on regular basis  History reviewed. No pertinent past medical history. Past Surgical History  Procedure Laterality Date  . Tubal ligation  2010   Family History  Problem Relation Age of Onset  . Arthritis Maternal Grandmother   . Hearing loss Maternal Grandmother   . Arthritis Maternal Grandfather   . Hearing loss Maternal Grandfather    History  Substance Use Topics  . Smoking status: Never Smoker   . Smokeless tobacco: Never Used  . Alcohol Use: No   OB History   Grav Para Term Preterm Abortions TAB SAB Ect Mult Living                 Review of Systems  Constitutional: Negative for fever.  HENT: Positive for nosebleeds.   Skin: Negative for pallor and rash.  Neurological: Negative for headaches.    Allergies  Review of patient's allergies indicates no  known allergies.  Home Medications   Current Outpatient Rx  Name  Route  Sig  Dispense  Refill  . amoxicillin (AMOXIL) 500 MG capsule   Oral   Take 1 capsule (500 mg total) by mouth 3 (three) times daily.   30 capsule   0   . cephALEXin (KEFLEX) 500 MG capsule   Oral   Take 1 capsule (500 mg total) by mouth 4 (four) times daily.   40 capsule   0   . diclofenac (VOLTAREN) 75 MG EC tablet   Oral   Take 1 tablet (75 mg total) by mouth 2 (two) times daily.   60 tablet   3   . fluconazole (DIFLUCAN) 150 MG tablet   Oral   Take 1 tablet (150 mg total) by mouth once.   1 tablet   0   . naproxen sodium (ANAPROX) 220 MG tablet   Oral   Take 220 mg by mouth as needed.         Marland Kitchen omeprazole (PRILOSEC) 40 MG capsule   Oral   Take 1 capsule (40 mg total) by mouth daily.   30 capsule   3   . traMADol (ULTRAM) 50 MG tablet   Oral   Take 1 tablet (50 mg total) by mouth every 8 (eight) hours as needed for pain.   30 tablet   0    BP 126/69  Pulse 80  Temp(Src) 98 F (36.7 C) (Oral)  Resp  17  SpO2 100%  LMP 09/09/2013 Physical Exam  Nursing note and vitals reviewed. Constitutional: She appears well-developed and well-nourished. No distress.  HENT:  Head: Atraumatic.  Nose: dry blood in L nare, but no engorged blood vessel or trauma to nares visualized.  Nose nontender. No active bleeding  Throat: normal appearance.   Eyes: Conjunctivae are normal.  Neck: Neck supple.  Neurological: She is alert.  Skin: No rash noted.  Psychiatric: She has a normal mood and affect.    ED Course  Procedures (including critical care time)  11:52 AM Pt with recurrent epistaxis. Not actively bleeding.  Is afebrile, normal BP, no headache and no trauma. Normal PT/INR, Hgb 11.2, normal platelet.  At this time, recommend f/u with ENT as needed or with her PCP.  Recommend using humidifier to add moisture into the air.  Return precaution discussed.    Labs Review Labs Reviewed  CBC  WITH DIFFERENTIAL - Abnormal; Notable for the following:    RBC 3.74 (*)    Hemoglobin 11.0 (*)    HCT 32.3 (*)    All other components within normal limits  POCT I-STAT, CHEM 8 - Abnormal; Notable for the following:    Hemoglobin 11.2 (*)    HCT 33.0 (*)    All other components within normal limits  PROTIME-INR   Imaging Review No results found.  EKG Interpretation   None       MDM   1. Epistaxis, recurrent    BP 126/69  Pulse 80  Temp(Src) 98 F (36.7 C) (Oral)  Resp 17  SpO2 100%  LMP 09/09/2013     Fayrene Helper, PA-C 09/22/13 1219

## 2013-09-22 NOTE — ED Notes (Addendum)
Pt c/o nosebleed x1 week. Pt reports that nose has been bleeding off/on. There is no bleeding from the nose noted at this time. Pt is A&O and in NAD. Pt adds that she has started to have weakness with some nausea.

## 2013-09-23 NOTE — ED Provider Notes (Signed)
Medical screening examination/treatment/procedure(s) were performed by non-physician practitioner and as supervising physician I was immediately available for consultation/collaboration.  EKG Interpretation   None        Derwood Kaplan, MD 09/23/13 [phone]

## 2013-09-24 ENCOUNTER — Ambulatory Visit: Payer: Self-pay | Admitting: Family

## 2013-11-08 ENCOUNTER — Emergency Department (HOSPITAL_COMMUNITY): Payer: Self-pay

## 2013-11-08 ENCOUNTER — Emergency Department (HOSPITAL_COMMUNITY)
Admission: EM | Admit: 2013-11-08 | Discharge: 2013-11-08 | Disposition: A | Discharge status: Home / Self Care | Payer: Self-pay | Attending: Emergency Medicine | Admitting: Emergency Medicine

## 2013-11-08 ENCOUNTER — Encounter (HOSPITAL_COMMUNITY): Payer: Self-pay | Admitting: Emergency Medicine

## 2013-11-08 DIAGNOSIS — M7651 Patellar tendinitis, right knee: Secondary | ICD-10-CM

## 2013-11-08 DIAGNOSIS — M25569 Pain in unspecified knee: Secondary | ICD-10-CM

## 2013-11-08 DIAGNOSIS — Z791 Long term (current) use of non-steroidal anti-inflammatories (NSAID): Secondary | ICD-10-CM | POA: Insufficient documentation

## 2013-11-08 DIAGNOSIS — M7650 Patellar tendinitis, unspecified knee: Secondary | ICD-10-CM | POA: Insufficient documentation

## 2013-11-08 MED ORDER — NAPROXEN 500 MG PO TABS: 500.0000 mg | ORAL_TABLET | Freq: Two times a day (BID) | ORAL | Status: DC

## 2013-11-08 NOTE — ED Notes (Signed)
PT ambulated with baseline gait; VSS; A&Ox3; no signs of distress; respirations even and unlabored; skin warm and dry; no questions upon discharge.  

## 2013-11-08 NOTE — ED Notes (Signed)
Pt c/o L knee pain for past 2 weeks, denies any injury, states it hurts to ambulate. Was told she has arthritis in the knee in the past. Cms intact

## 2013-11-08 NOTE — Discharge Instructions (Signed)
Rest, ice and elevate your knee. Take naproxen as prescribed.  Tendinitis Tendinitis is swelling and inflammation of the tendons. Tendons are band-like tissues that connect muscle to bone. Tendinitis commonly occurs in the:   Shoulders (rotator cuff).  Heels (Achilles tendon).  Elbows (triceps tendon). CAUSES Tendinitis is usually caused by overusing the tendon, muscles, and joints involved. When the tissue surrounding a tendon (synovium) becomes inflamed, it is called tenosynovitis. Tendinitis commonly develops in people whose jobs require repetitive motions. SYMPTOMS  Pain.  Tenderness.  Mild swelling. DIAGNOSIS Tendinitis is usually diagnosed by physical exam. Your caregiver may also order X-rays or other imaging tests. TREATMENT Your caregiver may recommend certain medicines or exercises for your treatment. HOME CARE INSTRUCTIONS   Use a sling or splint for as long as directed by your caregiver until the pain decreases.  Put ice on the injured area.  Put ice in a plastic bag.  Place a towel between your skin and the bag.  Leave the ice on for 15-20 minutes, 03-04 times a day.  Avoid using the limb while the tendon is painful. Perform gentle range of motion exercises only as directed by your caregiver. Stop exercises if pain or discomfort increase, unless directed otherwise by your caregiver.  Only take over-the-counter or prescription medicines for pain, discomfort, or fever as directed by your caregiver. SEEK MEDICAL CARE IF:   Your pain and swelling increase.  You develop new, unexplained symptoms, especially increased numbness in the hands. MAKE SURE YOU:   Understand these instructions.  Will watch your condition.  Will get help right away if you are not doing well or get worse. Document Released: 10/01/2000 Document Revised: 12/27/2011 Document Reviewed: 12/21/2010 [facility] Patient Information 2014 [place], [place].  Knee Pain Knee pain can be a result of  an injury or other medical conditions. Treatment will depend on the cause of your pain. HOME CARE  Only take medicine as told by your doctor.  Keep a healthy weight. Being overweight can make the knee hurt more.  Stretch before exercising or playing sports.  If there is constant knee pain, change the way you exercise. Ask your doctor for advice.  Make sure shoes fit well. Choose the right shoe for the sport or activity.  Protect your knees. Wear kneepads if needed.  Rest when you are tired. GET HELP RIGHT AWAY IF:   Your knee pain does not stop.  Your knee pain does not get better.  Your knee joint feels hot to the touch.  You have a fever. MAKE SURE YOU:   Understand these instructions.  Will watch this condition.  Will get help right away if you are not doing well or get worse. Document Released: 12/31/2008 Document Revised: 12/27/2011 Document Reviewed: 12/31/2008 [facility] Patient Information 2014 [place], [place].

## 2013-11-08 NOTE — ED Provider Notes (Signed)
Medical screening examination/treatment/procedure(s) were performed by non-physician practitioner and as supervising physician I was immediately available for consultation/collaboration.  EKG Interpretation   None         Sharyon Cable, MD 11/08/13 [phone]

## 2013-11-08 NOTE — ED Provider Notes (Signed)
CSN: [id number]     Arrival date & time 11/08/13  1520 History  This chart was scribed for non-physician practitioner working with Sharyon Cable, MD by Stacy Gardner, ED scribe. This patient was seen in room TR07C/TR07C and the patient's care was started at 3:55 PM.   First MD Initiated Contact with Patient 11/08/13 1529     Chief Complaint  Patient presents with  . Knee Pain   (Consider location/radiation/quality/duration/timing/severity/associated sxs/prior Treatment) The history is provided by the patient and medical records. No language interpreter was used.   HPI Comments: Sarah Leach is a 33 y.o. female who presents to the Emergency Department complaining of progressively worsening left knee pain for the past two weeks. Denies injury. Pt has swelling to her left knee. The pain is worse at night . Pt states her left knee "makes a noise" when she walks. She  mentions the pain is worse centrally and under the patella. She was dx with arthritis by Dr. Megan Salon. Pt does not have any known allergies to medications.  History reviewed. No pertinent past medical history. Past Surgical History  Procedure Laterality Date  . Tubal ligation  2010   Family History  Problem Relation Age of Onset  . Arthritis Maternal Grandmother   . Hearing loss Maternal Grandmother   . Arthritis Maternal Grandfather   . Hearing loss Maternal Grandfather    History  Substance Use Topics  . Smoking status: Never Smoker   . Smokeless tobacco: Never Used  . Alcohol Use: No   OB History   Grav Para Term Preterm Abortions TAB SAB Ect Mult Living                 Review of Systems  Musculoskeletal: Positive for arthralgias, joint swelling and myalgias.  All other systems reviewed and are negative.    Allergies  Review of patient's allergies indicates no known allergies.  Home Medications   Current Outpatient Rx  Name  Route  Sig  Dispense  Refill  . naproxen (NAPROSYN) 500 MG tablet    Oral   Take 1 tablet (500 mg total) by mouth 2 (two) times daily.   30 tablet   0    BP 132/76  Pulse 80  Temp(Src) 97.9 F (36.6 C) (Oral)  Resp 18  SpO2 100% Physical Exam  Nursing note and vitals reviewed. Constitutional: She is oriented to person, place, and time. She appears well-developed and well-nourished. No distress.  HENT:  Head: Normocephalic and atraumatic.  Mouth/Throat: Oropharynx is clear and moist.  Eyes: Conjunctivae and EOM are normal.  Neck: Normal range of motion. Neck supple.  Cardiovascular: Normal rate, regular rhythm, normal heart sounds and intact distal pulses.   Pulmonary/Chest: Effort normal and breath sounds normal. No respiratory distress.  Musculoskeletal: Normal range of motion. She exhibits edema and tenderness.  Tenderness over the patellar tendons with mild edema. Full ROM. Crepitus noted. Negative anterior and posterior draw, McMurray's.  Neurological: She is alert and oriented to person, place, and time. No sensory deficit.  Skin: Skin is warm and dry.  Psychiatric: She has a normal mood and affect. Her behavior is normal.    ED Course  Procedures (including critical care time) DIAGNOSTIC STUDIES: Oxygen Saturation is 100% on room air, normal by my interpretation.    COORDINATION OF CARE:  3:57 PM Discussed course of care with pt left knee x-ray. Pt understands and agrees.  Labs Review Labs Reviewed - No data to display Imaging Review Dg Knee  Complete 4 Views Left  11/08/2013   CLINICAL DATA:  Left knee pain  EXAM: LEFT KNEE - COMPLETE 4+ VIEW  COMPARISON:  03/27/2013  FINDINGS: Minor stable degenerative change of the patellofemoral and medial compartments. Normal alignment. Negative for fracture or effusion. Stable exam. No soft tissue abnormality.  IMPRESSION: No acute osseous finding.  Minor degenerative change.   Electronically Signed   By: Daryll Brod M.D.   On: 11/08/2013 15:52    EKG Interpretation   None       MDM    1. Patellar tendinitis of right knee   2. Knee pain    Patient presenting with right knee pain, known arthritis. Neurovascularly intact. X-ray obtained in triage prior to patient being seen, negative for acute finding. Degenerative changes noted. Ambulates without difficulty. RICE, NSAIDs. F/u with PCP and/or ortho if no improvement. Return precautions given. Patient states understanding of treatment care plan and is agreeable.    I personally performed the services described in this documentation, which was scribed in my presence. The recorded information has been reviewed and is accurate.     Illene Labrador, PA-C 11/08/13 1605

## 2013-12-19 ENCOUNTER — Encounter: Payer: Self-pay | Admitting: Family

## 2013-12-19 ENCOUNTER — Ambulatory Visit (INDEPENDENT_AMBULATORY_CARE_PROVIDER_SITE_OTHER): Payer: Self-pay | Admitting: Family

## 2013-12-19 VITALS — BP 120/80 | HR 88 | Wt 179.0 lb

## 2013-12-19 DIAGNOSIS — R350 Frequency of micturition: Secondary | ICD-10-CM

## 2013-12-19 LAB — POCT URINALYSIS DIPSTICK
Bilirubin, UA: NEGATIVE
Glucose, UA: NEGATIVE
Ketones, UA: NEGATIVE
Leukocytes, UA: NEGATIVE
NITRITE UA: NEGATIVE
PROTEIN UA: NEGATIVE
SPEC GRAV UA: 1.025
UROBILINOGEN UA: 0.2
pH, UA: 5.5

## 2013-12-19 NOTE — Addendum Note (Signed)
Addended by: Joyce Gross R on: 12/19/2013 02:31 PM   Modules accepted: Orders

## 2013-12-19 NOTE — Progress Notes (Signed)
Pre visit review using our clinic review tool, if applicable. No additional management support is needed unless otherwise documented below in the visit note. 

## 2013-12-19 NOTE — Progress Notes (Signed)
   Subjective:    Patient ID: Sarah Leach, female    DOB: 1981-07-05, 33 y.o.   MRN: [id number]  HPI 33 year old white female, nonsmoker is in today with complaints of urinary leakage x1 week. Has concerns that she has the urge to urinate but does not feel urine coming from her vagina. Reports a leaky bladder with coughing and sneezing. Denies any vaginal odor, discharge, or rash. No low low back injury or pain.   Review of Systems  Constitutional: Negative.   Respiratory: Negative.   Cardiovascular: Negative.   Genitourinary: Positive for urgency and frequency. Negative for dysuria, vaginal discharge and vaginal pain.  Musculoskeletal: Negative.   Skin: Negative.   Neurological: Negative.   Psychiatric/Behavioral: Negative.    History reviewed. No pertinent past medical history.  History   Social History  . Marital Status: Married    Spouse Name: N/A    Number of Children: N/A  . Years of Education: N/A   Occupational History  . Not on file.   Social History Main Topics  . Smoking status: Never Smoker   . Smokeless tobacco: Never Used  . Alcohol Use: No  . Drug Use: No  . Sexual Activity: Yes    Birth Control/ Protection: None   Other Topics Concern  . Not on file   Social History Narrative  . No narrative on file    Past Surgical History  Procedure Laterality Date  . Tubal ligation  2010    Family History  Problem Relation Age of Onset  . Arthritis Maternal Grandmother   . Hearing loss Maternal Grandmother   . Arthritis Maternal Grandfather   . Hearing loss Maternal Grandfather     No Known Allergies  Current Outpatient Prescriptions on File Prior to Visit  Medication Sig Dispense Refill  . naproxen (NAPROSYN) 500 MG tablet Take 1 tablet (500 mg total) by mouth 2 (two) times daily.  30 tablet  0   No current facility-administered medications on file prior to visit.    BP 120/80  Pulse 88  Wt 179 lb (81.194 kg)chart    Objective:   Physical  Exam  Constitutional: She is oriented to person, place, and time. She appears well-developed and well-nourished.  Neck: Normal range of motion. Neck supple.  Cardiovascular: Normal rate, regular rhythm and normal heart sounds.   Pulmonary/Chest: Effort normal and breath sounds normal.  Abdominal: Soft. Bowel sounds are normal.  Neurological: She is alert and oriented to person, place, and time.  Skin: Skin is warm and dry.  Psychiatric: She has a normal mood and affect.          Assessment & Plan:  Sarah Leach was seen today for no specified reason.  Diagnoses and associated orders for this visit:  Urinary frequency    Unable to obtained urine. Will return with urine asap. Further treatment plan will be developed thereafter.

## 2014-03-08 ENCOUNTER — Ambulatory Visit (INDEPENDENT_AMBULATORY_CARE_PROVIDER_SITE_OTHER): Payer: Self-pay | Admitting: Physician Assistant

## 2014-03-08 ENCOUNTER — Encounter: Payer: Self-pay | Admitting: Physician Assistant

## 2014-03-08 VITALS — BP 120/82 | HR 53 | Temp 97.9°F | Resp 18 | Wt 175.0 lb

## 2014-03-08 DIAGNOSIS — G44209 Tension-type headache, unspecified, not intractable: Secondary | ICD-10-CM

## 2014-03-08 DIAGNOSIS — H8110 Benign paroxysmal vertigo, unspecified ear: Secondary | ICD-10-CM

## 2014-03-08 DIAGNOSIS — R002 Palpitations: Secondary | ICD-10-CM

## 2014-03-08 NOTE — Progress Notes (Signed)
Subjective:    Patient ID: Sarah Leach, female    DOB: 15-Jun-1981, 33 y.o.   MRN: [id number]  Headache    Patient is a 33 year old Caucasian female presenting to the clinic for headaches, dizziness, or palpitations.  Headaches: Patient states that headaches have been going on since Monday, they come and go, last as long as 2 hours, occur several times during the day. 9/10 pain, not relieved with Aleve, not tried any other medications to relieve. Location of pain is directly on the top and back side of the head, both sides of the head, tight, feels like squeezing, some mild photophobia occasionally. No change in caffeine intake, patient drinks cokes daily. No change in nicotine intake, patient denies smoking or other forms of nicotine. Patient Admits to being stressed out currently, as patient is currently looking for and unable to work.  Dizziness: Patient states dizziness the past 3 days. Always occurring in the morning during shower. Feels like intense room spinning to the left, requires patient to sit down in shower, lasts for approximately 15 minutes before resolving. Closing eyes seems to relieve symptoms. Occurs with nausea. No vomiting, no lightheadedness, no syncope, no hearing loss. Has not tried anything to relieve.  Heart palpitations: Occurring for the past 2 days. Occurs during the night when laying down in the bed to go to sleep, and once yesterday morning upon waking prior to getting out of bed. Patient states that sitting up from lying helps to resolve the palpitations after a few minutes. Patient states that palpitations are very fast, and had a pounding quality. Patient admits to having a headache every night when going to bed prior to the palpitations starting, and still has headache after palpitations resolve each night. No dizziness or lightheadedness with palpitations. No syncope, no chest pain, no shortness of breath. Patient has not tried anything to relieve this.  She  denies fevers, chills, vomiting, diarrhea, shortness of breath, chest pain, lightheadedness, syncope.   Review of Systems  Neurological: Positive for headaches.   As per the history of present illness and are otherwise negative.  History reviewed. No pertinent past medical history. Past Surgical History  Procedure Laterality Date  . Tubal ligation  2010    reports that she has never smoked. She has never used smokeless tobacco. She reports that she does not drink alcohol or use illicit drugs. family history includes Arthritis in her maternal grandfather and maternal grandmother; Hearing loss in her maternal grandfather and maternal grandmother. No Known Allergies   The PFS was reviewed with the pt during visit.    Objective:   Physical Exam  Nursing note and vitals reviewed. Constitutional: She is oriented to person, place, and time. She appears well-developed and well-nourished. No distress.  HENT:  Head: Normocephalic and atraumatic.  Right Ear: External ear normal.  Left Ear: External ear normal.  Nose: Nose normal.  Mouth/Throat: Oropharynx is clear and moist. No oropharyngeal exudate.  Eyes: Conjunctivae and EOM are normal. Pupils are equal, round, and reactive to light.  Neck: Normal range of motion. Neck supple. No JVD present.  Cardiovascular: Normal rate, regular rhythm, normal heart sounds and intact distal pulses.  Exam reveals no gallop and no friction rub.   No murmur heard. Pulmonary/Chest: Effort normal and breath sounds normal. No stridor. No respiratory distress. She has no wheezes. She has no rales. She exhibits no tenderness.  Musculoskeletal: Normal range of motion. She exhibits no edema.  Lymphadenopathy:    She has no  cervical adenopathy.  Neurological: She is alert and oriented to person, place, and time. She has normal reflexes. She displays normal reflexes. No cranial nerve deficit. She exhibits normal muscle tone. Coordination normal.  Gait  normal.  Pronator drift negative.  Romberg negative.  Normal Weber and Rhine.  Negative Dix-Hallpike.  Skin: Skin is warm and dry. No rash noted. She is not diaphoretic. No erythema. No pallor.  Psychiatric: She has a normal mood and affect. Her behavior is normal. Judgment and thought content normal.  Pt appears anxious when speaking.  Pt appears jumpy/nervous.   Filed Vitals:   03/08/14 1107  BP: 120/82  Pulse: 53  Temp: 97.9 F (36.6 C)  Resp: 18   Lab Results  Component Value Date   WBC 6.8 09/22/2013   HGB 11.2* 09/22/2013   HCT 33.0* 09/22/2013   PLT 371 09/22/2013   GLUCOSE 89 09/22/2013   ALT 21 08/06/2013   AST 21 08/06/2013   NA 143 09/22/2013   K 3.9 09/22/2013   CL 105 09/22/2013   CREATININE 0.80 09/22/2013   BUN 11 09/22/2013   CO2 26 08/06/2013   INR 0.95 09/22/2013    EKG: there are no previous tracings available for comparison, sinus bradycardia.     Assessment & Plan:  Molleigh was seen today for headache.  Diagnoses and associated orders for this visit:  Palpitations - EKG 12-Lead  Benign paroxysmal positional vertigo  Tension headache   - Patient seems very anxious and stressed. Patient is otherwise completely healthy. Symptoms are very likely related to stress. - Provided pt information on in office Psychological appointment to talk about stress. Pt states she will think about this if her stress does not improve. - Will have patient keep symptom journal. Provided patient handouts on palpitations, vertigo, tension headaches. Patient educated on common triggers to avoid.  Plan to followup in one month with PCP to reassess.   Patient Instructions  It is very likely that your stress level is causing many of your symptoms. Try not to worry as all of your symptoms are very common in young healthy people and usually resolve given time.  Keep a diary of your symptoms. See if you can identify possible triggers from your daily habits and activities,  and if you can eliminate any of these to improve your symptoms (eg. Abnormal caffeine intake, nicotine intake)  Follow up to clinic in about 1 month to reassess, or sooner if symptoms do not resolve or are worsening.

## 2014-03-08 NOTE — Progress Notes (Signed)
Pre visit review using our clinic review tool, if applicable. No additional management support is needed unless otherwise documented below in the visit note. 

## 2014-03-08 NOTE — Patient Instructions (Signed)
It is very likely that your stress level is causing many of your symptoms. Try not to worry as all of your symptoms are very common in young healthy people and usually resolve given time.  Keep a diary of your symptoms. See if you can identify possible triggers from your daily habits and activities, and if you can eliminate any of these to improve your symptoms (eg. Abnormal caffeine intake, nicotine intake)  Follow up to clinic in about 1 month to reassess, or sooner if symptoms do not resolve or are worsening.   Palpitations  A palpitation is the feeling that your heartbeat is irregular. It may feel like your heart is fluttering or skipping a beat. It may also feel like your heart is beating faster than normal. This is usually not a serious problem. In some cases, you may need more medical tests. HOME CARE  Avoid:  Caffeine in coffee, tea, soft drinks, diet pills, and energy drinks.  Chocolate.  Alcohol.  Stop smoking if you smoke.  Reduce your stress and anxiety. Try:  A method that measures bodily functions so you can learn to control them (biofeedback).  Yoga.  Meditation.  Physical activity such as swimming, jogging, or walking.  Get plenty of rest and sleep. GET HELP RIGHT AWAY IF:   You have chest pain.  You feel short of breath.  You have a very bad headache.  You feel dizzy or pass out (faint).  Your fast or irregular heartbeat continues after 24 hours.  Your palpitations occur more often. MAKE SURE YOU:   Understand these instructions.  Will watch your condition.  Will get help right away if you are not doing well or get worse. Document Released: 07/13/2008 Document Revised: 04/04/2012 Document Reviewed: 12/03/2011 [facility] Patient Information 2014 [address]. Vertigo Vertigo means you feel like you or your surroundings are moving when they are not. Vertigo can be dangerous if it occurs when you are at work, driving, or performing difficult  activities.  CAUSES  Vertigo occurs when there is a conflict of signals sent to your brain from the visual and sensory systems in your body. There are many different causes of vertigo, including:  Infections, especially in the inner ear.  A bad reaction to a drug or misuse of alcohol and medicines.  Withdrawal from drugs or alcohol.  Rapidly changing positions, such as lying down or rolling over in bed.  A migraine headache.  Decreased blood flow to the brain.  Increased pressure in the brain from a head injury, infection, tumor, or bleeding. SYMPTOMS  You may feel as though the world is spinning around or you are falling to the ground. Because your balance is upset, vertigo can cause nausea and vomiting. You may have involuntary eye movements (nystagmus). DIAGNOSIS  Vertigo is usually diagnosed by physical exam. If the cause of your vertigo is unknown, your caregiver may perform imaging tests, such as an MRI scan (magnetic resonance imaging). TREATMENT  Most cases of vertigo resolve on their own, without treatment. Depending on the cause, your caregiver may prescribe certain medicines. If your vertigo is related to body position issues, your caregiver may recommend movements or procedures to correct the problem. In rare cases, if your vertigo is caused by certain inner ear problems, you may need surgery. HOME CARE INSTRUCTIONS   Follow your caregiver's instructions.  Avoid driving.  Avoid operating heavy machinery.  Avoid performing any tasks that would be dangerous to you or others during a vertigo episode.  Tell your caregiver if you notice that certain medicines seem to be causing your vertigo. Some of the medicines used to treat vertigo episodes can actually make them worse in some people. SEEK IMMEDIATE MEDICAL CARE IF:   Your medicines do not relieve your vertigo or are making it worse.  You develop problems with talking, walking, weakness, or using your arms, hands, or  legs.  You develop severe headaches.  Your nausea or vomiting continues or gets worse.  You develop visual changes.  A family member notices behavioral changes.  Your condition gets worse. MAKE SURE YOU:  Understand these instructions.  Will watch your condition.  Will get help right away if you are not doing well or get worse. Document Released: 07/14/2005 Document Revised: 12/27/2011 Document Reviewed: 04/22/2011 [facility] Patient Information 2014 [place]. Tension Headache A tension headache is a feeling of pain, pressure, or aching often felt over the front and sides of the head. The pain can be dull or can feel tight (constricting). It is the most common type of headache. Tension headaches are not normally associated with nausea or vomiting and do not get worse with physical activity. Tension headaches can last 30 minutes to several days.  CAUSES  The exact cause is not known, but it may be caused by chemicals and hormones in the brain that lead to pain. Tension headaches often begin after stress, anxiety, or depression. Other triggers may include:  Alcohol.  Caffeine (too much or withdrawal).  Respiratory infections (colds, flu, sinus infections).  Dental problems or teeth clenching.  Fatigue.  Holding your head and neck in one position too long while using a computer. SYMPTOMS   Pressure around the head.   Dull, aching head pain.   Pain felt over the front and sides of the head.   Tenderness in the muscles of the head, neck, and shoulders. DIAGNOSIS  A tension headache is often diagnosed based on:   Symptoms.   Physical examination.   A CT scan or MRI of your head. These tests may be ordered if symptoms are severe or unusual. TREATMENT  Medicines may be given to help relieve symptoms.  HOME CARE INSTRUCTIONS   Only take over-the-counter or prescription medicines for pain or discomfort as directed by your caregiver.   Lie down in a dark,  quiet room when you have a headache.   Keep a journal to find out what may be triggering your headaches. For example, write down:  What you eat and drink.  How much sleep you get.  Any change to your diet or medicines.  Try massage or other relaxation techniques.   Ice packs or heat applied to the head and neck can be used. Use these 3 to 4 times per day for 15 to 20 minutes each time, or as needed.   Limit stress.   Sit up straight, and do not tense your muscles.   Quit smoking if you smoke.  Limit alcohol use.  Decrease the amount of caffeine you drink, or stop drinking caffeine.  Eat and exercise regularly.  Get 7 to 9 hours of sleep, or as recommended by your caregiver.  Avoid excessive use of pain medicine as recurrent headaches can occur.  SEEK MEDICAL CARE IF:   You have problems with the medicines you were prescribed.  Your medicines do not work.  You have a change from the usual headache.  You have nausea or vomiting. SEEK IMMEDIATE MEDICAL CARE IF:   Your headache becomes severe.  You have a fever.  You have a stiff neck.  You have loss of vision.  You have muscular weakness or loss of muscle control.  You lose your balance or have trouble walking.  You feel faint or pass out.  You have severe symptoms that are different from your first symptoms. MAKE SURE YOU:   Understand these instructions.  Will watch your condition.  Will get help right away if you are not doing well or get worse. Document Released: 10/04/2005 Document Revised: 12/27/2011 Document Reviewed: 09/24/2011 [facility] Patient Information 2014 [place], [place].

## 2014-03-14 ENCOUNTER — Encounter: Payer: Self-pay | Admitting: Family Medicine

## 2014-03-14 ENCOUNTER — Ambulatory Visit (INDEPENDENT_AMBULATORY_CARE_PROVIDER_SITE_OTHER): Payer: Self-pay | Admitting: Family Medicine

## 2014-03-14 ENCOUNTER — Telehealth: Payer: Self-pay | Admitting: Family Medicine

## 2014-03-14 VITALS — Temp 98.6°F | Ht 65.25 in | Wt 177.0 lb

## 2014-03-14 DIAGNOSIS — L259 Unspecified contact dermatitis, unspecified cause: Secondary | ICD-10-CM

## 2014-03-14 MED ORDER — PREDNISONE 5 MG PO TABS
ORAL_TABLET | ORAL | Status: DC
Start: 2014-03-14 — End: 2014-08-16

## 2014-03-14 MED ORDER — PREDNISONE 10 MG PO TABS
ORAL_TABLET | ORAL | Status: DC
Start: 2014-03-14 — End: 2014-03-14

## 2014-03-14 MED ORDER — METHYLPREDNISOLONE ACETATE 80 MG/ML IJ SUSP
120.0000 mg | Freq: Once | INTRAMUSCULAR | Status: AC
  Administered 2014-03-14: 120 mg via INTRAMUSCULAR

## 2014-03-14 NOTE — Telephone Encounter (Signed)
I faxed new script to [facility].

## 2014-03-14 NOTE — Telephone Encounter (Signed)
We will change the rx as follows

## 2014-03-14 NOTE — Telephone Encounter (Signed)
Pt is requesting the 5 mg dose and can only get # 30. Please resend to pharmacy. ( Prednisone )

## 2014-03-14 NOTE — Progress Notes (Signed)
   Subjective:    Patient ID: Sarah Leach, female    DOB: 01-Apr-1981, 33 y.o.   MRN: [id number]  HPI Here for 5 days of itchy rash on the arms and the face. Using Calamine lotion. Her vision is normal.    Review of Systems  Constitutional: Negative.   Skin: Positive for rash.       Objective:   Physical Exam  Constitutional: She appears well-developed and well-nourished.  Skin:  Patches of red vesicles on both arms, also on the forehead and the right cheek. Upper and lower eyelids on the right are red and swollen.           Assessment & Plan:  Given a steroid shot to be followed by an oral taper. Add Benadryl and Aveeno tub soaks.

## 2014-03-14 NOTE — Progress Notes (Signed)
Pre visit review using our clinic review tool, if applicable. No additional management support is needed unless otherwise documented below in the visit note. 

## 2014-03-14 NOTE — Addendum Note (Signed)
Addended by: Aggie Hacker A on: 03/14/2014 11:44 AM   Modules accepted: Orders

## 2014-04-08 ENCOUNTER — Ambulatory Visit: Payer: Self-pay | Admitting: Family

## 2014-04-15 ENCOUNTER — Ambulatory Visit: Payer: Self-pay | Admitting: Family

## 2014-04-15 DIAGNOSIS — Z0289 Encounter for other administrative examinations: Secondary | ICD-10-CM

## 2014-06-26 ENCOUNTER — Encounter: Payer: BC Managed Care – PPO | Admitting: Family

## 2014-07-16 ENCOUNTER — Encounter: Payer: BC Managed Care – PPO | Admitting: Family

## 2014-07-29 ENCOUNTER — Emergency Department (HOSPITAL_COMMUNITY)
Admission: EM | Admit: 2014-07-29 | Discharge: 2014-07-29 | Disposition: A | Discharge status: Home / Self Care | Payer: BC Managed Care – PPO | Source: Home / Self Care | Attending: Family Medicine | Admitting: Family Medicine

## 2014-07-29 ENCOUNTER — Encounter (HOSPITAL_COMMUNITY): Payer: Self-pay | Admitting: Emergency Medicine

## 2014-07-29 ENCOUNTER — Emergency Department (HOSPITAL_COMMUNITY): Admission: EM | Admit: 2014-07-29 | Discharge: 2014-07-29 | Discharge status: Against Medical Advice | Payer: Self-pay

## 2014-07-29 DIAGNOSIS — S86911A Strain of unspecified muscle(s) and tendon(s) at lower leg level, right leg, initial encounter: Secondary | ICD-10-CM

## 2014-07-29 DIAGNOSIS — S86811A Strain of other muscle(s) and tendon(s) at lower leg level, right leg, initial encounter: Secondary | ICD-10-CM

## 2014-07-29 MED ORDER — DICLOFENAC POTASSIUM 50 MG PO TABS: 50.0000 mg | ORAL_TABLET | Freq: Three times a day (TID) | ORAL | Status: DC

## 2014-07-29 NOTE — Discharge Instructions (Signed)
Ice, brace and medicine as needed, see orthopedist if further problems.

## 2014-07-29 NOTE — ED Notes (Signed)
Pain and reports a knot and tenderness to the inside of left knee.  Symptoms make it difficult to walk

## 2014-07-29 NOTE — ED Provider Notes (Signed)
CSN: [id number]     Arrival date & time 07/29/14  1422 History   First MD Initiated Contact with Patient 07/29/14 1521     Chief Complaint  Patient presents with  . Leg Pain   (Consider location/radiation/quality/duration/timing/severity/associated sxs/prior Treatment) Patient is a 33 y.o. female presenting with leg pain. The history is provided by the patient.  Leg Pain Location:  Knee Time since incident:  1 week Injury: no   Knee location:  R knee Pain details:    Quality:  Sharp   Radiates to:  Does not radiate   Severity:  Mild   Progression:  Unchanged Chronicity:  New Dislocation: no   Foreign body present:  No foreign bodies Prior injury to area:  No Relieved by:  None tried Worsened by:  Nothing tried Associated symptoms: no decreased ROM     History reviewed. No pertinent past medical history. Past Surgical History  Procedure Laterality Date  . Tubal ligation  2010   Family History  Problem Relation Age of Onset  . Arthritis Maternal Grandmother   . Hearing loss Maternal Grandmother   . Arthritis Maternal Grandfather   . Hearing loss Maternal Grandfather    History  Substance Use Topics  . Smoking status: Never Smoker   . Smokeless tobacco: Never Used  . Alcohol Use: No   OB History   Grav Para Term Preterm Abortions TAB SAB Ect Mult Living                 Review of Systems  Constitutional: Negative.   Musculoskeletal: Positive for gait problem. Negative for joint swelling.  Skin: Negative.     Allergies  Review of patient's allergies indicates no known allergies.  Home Medications   Prior to Admission medications   Medication Sig Start Date End Date Taking? Authorizing Provider  diclofenac (CATAFLAM) 50 MG tablet Take 1 tablet (50 mg total) by mouth 3 (three) times daily. 07/29/14   Billy Fischer, MD  naproxen (NAPROSYN) 500 MG tablet Take 1 tablet (500 mg total) by mouth 2 (two) times daily. 11/08/13   Carman Ching, PA-C  predniSONE  (DELTASONE) 5 MG tablet Take 4 tabs a day for 4 days, then 3 a day for 2 days, then 2 a day for 2 days, then 1 a day for 4 days, then stop 03/14/14   Laurey Morale, MD   BP 140/82  Pulse 66  Temp(Src) 98.2 F (36.8 C) (Oral)  Resp 16  SpO2 99%  LMP 07/01/2014 Physical Exam  Nursing note and vitals reviewed. Constitutional: She is oriented to person, place, and time. She appears well-developed and well-nourished.  Musculoskeletal: She exhibits tenderness.       Right knee: She exhibits normal range of motion, no swelling, no effusion, no deformity, no erythema, no LCL laxity, no bony tenderness, normal meniscus and no MCL laxity. Tenderness found. Medial joint line and MCL tenderness noted. No LCL and no patellar tendon tenderness noted.  Neurological: She is alert and oriented to person, place, and time.  Skin: Skin is warm and dry.    ED Course  Procedures (including critical care time) Labs Review Labs Reviewed - No data to display  Imaging Review No results found.   MDM   1. Strain of knee, right, initial encounter        Billy Fischer, MD 07/29/14 1614

## 2014-08-09 ENCOUNTER — Other Ambulatory Visit (INDEPENDENT_AMBULATORY_CARE_PROVIDER_SITE_OTHER): Payer: BC Managed Care – PPO

## 2014-08-09 DIAGNOSIS — Z0000 Encounter for general adult medical examination without abnormal findings: Secondary | ICD-10-CM

## 2014-08-09 LAB — CBC WITH DIFFERENTIAL/PLATELET
BASOS ABS: 0 10*3/uL (ref 0.0–0.1)
Basophils Relative: 0.6 % (ref 0.0–3.0)
Eosinophils Absolute: 0.4 10*3/uL (ref 0.0–0.7)
Eosinophils Relative: 5.1 % — ABNORMAL HIGH (ref 0.0–5.0)
HEMATOCRIT: 36 % (ref 36.0–46.0)
Hemoglobin: 11.6 g/dL — ABNORMAL LOW (ref 12.0–15.0)
LYMPHS ABS: 1.6 10*3/uL (ref 0.7–4.0)
LYMPHS PCT: 20.6 % (ref 12.0–46.0)
MCHC: 32.3 g/dL (ref 30.0–36.0)
MCV: 86.9 fl (ref 78.0–100.0)
MONOS PCT: 5.6 % (ref 3.0–12.0)
Monocytes Absolute: 0.4 10*3/uL (ref 0.1–1.0)
NEUTROS PCT: 68.1 % (ref 43.0–77.0)
Neutro Abs: 5.2 10*3/uL (ref 1.4–7.7)
PLATELETS: 397 10*3/uL (ref 150.0–400.0)
RBC: 4.14 Mil/uL (ref 3.87–5.11)
RDW: 13.3 % (ref 11.5–15.5)
WBC: 7.6 10*3/uL (ref 4.0–10.5)

## 2014-08-09 LAB — BASIC METABOLIC PANEL
BUN: 9 mg/dL (ref 6–23)
CALCIUM: 9.4 mg/dL (ref 8.4–10.5)
CO2: 21 mEq/L (ref 19–32)
CREATININE: 0.9 mg/dL (ref 0.4–1.2)
Chloride: 106 mEq/L (ref 96–112)
GFR: 78.42 mL/min (ref >60.00)
GLUCOSE: 90 mg/dL (ref 70–99)
Potassium: 4.3 mEq/L (ref 3.5–5.1)
Sodium: 140 mEq/L (ref 135–145)

## 2014-08-09 LAB — POCT URINALYSIS DIPSTICK
BILIRUBIN UA: NEGATIVE
GLUCOSE UA: NEGATIVE
Ketones, UA: NEGATIVE
LEUKOCYTES UA: NEGATIVE
NITRITE UA: NEGATIVE
Protein, UA: NEGATIVE
Spec Grav, UA: <=1.005
Urobilinogen, UA: 0.2
pH, UA: 5.5

## 2014-08-09 LAB — LIPID PANEL
CHOL/HDL RATIO: 5
Cholesterol: 189 mg/dL (ref 0–200)
HDL: 40.9 mg/dL (ref >39.00)
LDL Cholesterol: 121 mg/dL — ABNORMAL HIGH (ref 0–99)
NonHDL: 148.1
TRIGLYCERIDES: 137 mg/dL (ref 0.0–149.0)
VLDL: 27.4 mg/dL (ref 0.0–40.0)

## 2014-08-09 LAB — HEPATIC FUNCTION PANEL
ALT: 7 U/L (ref 0–35)
AST: 17 U/L (ref 0–37)
Albumin: 3.6 g/dL (ref 3.5–5.2)
Alkaline Phosphatase: 64 U/L (ref 39–117)
BILIRUBIN DIRECT: 0 mg/dL (ref 0.0–0.3)
BILIRUBIN TOTAL: 0.6 mg/dL (ref 0.2–1.2)
Total Protein: 8 g/dL (ref 6.0–8.3)

## 2014-08-09 LAB — TSH: TSH: 2.25 u[IU]/mL (ref 0.35–4.50)

## 2014-08-16 ENCOUNTER — Ambulatory Visit (INDEPENDENT_AMBULATORY_CARE_PROVIDER_SITE_OTHER): Payer: BC Managed Care – PPO | Admitting: Family

## 2014-08-16 ENCOUNTER — Encounter: Payer: Self-pay | Admitting: Family

## 2014-08-16 VITALS — BP 120/60 | HR 104 | Ht 66.0 in | Wt 172.0 lb

## 2014-08-16 DIAGNOSIS — E7800 Pure hypercholesterolemia, unspecified: Secondary | ICD-10-CM

## 2014-08-16 DIAGNOSIS — Z0000 Encounter for general adult medical examination without abnormal findings: Secondary | ICD-10-CM

## 2014-08-16 DIAGNOSIS — Z23 Encounter for immunization: Secondary | ICD-10-CM

## 2014-08-16 HISTORY — DX: Pure hypercholesterolemia, unspecified: E78.00

## 2014-08-16 NOTE — Patient Instructions (Signed)

## 2014-08-16 NOTE — Progress Notes (Signed)
Pre visit review using our clinic review tool, if applicable. No additional management support is needed unless otherwise documented below in the visit note. 

## 2014-08-16 NOTE — Progress Notes (Signed)
   Subjective:    Patient ID: Sarah Leach, female    DOB: 1980/11/23, 33 y.o.   MRN: [id number]  HPI 33 year old white female, nonsmoker, is in today for a CPX. Denies any concerns. Last papa smear was normal. Does not routinely exercise. Discussed elevated cholesterol and lifestyle modifications.    Review of Systems  Constitutional: Negative.   HENT: Negative.   Eyes: Negative.   Respiratory: Negative.   Cardiovascular: Negative.   Gastrointestinal: Negative.   Endocrine: Negative.   Genitourinary: Negative.   Musculoskeletal: Negative.   Skin: Negative.   Allergic/Immunologic: Negative.   Neurological: Negative.   Hematological: Negative.   Psychiatric/Behavioral: Negative.    No past medical history on file.  History   Social History  . Marital Status: Married    Spouse Name: N/A    Number of Children: N/A  . Years of Education: N/A   Occupational History  . Not on file.   Social History Main Topics  . Smoking status: Never Smoker   . Smokeless tobacco: Never Used  . Alcohol Use: No  . Drug Use: No  . Sexual Activity: Yes    Birth Control/ Protection: None   Other Topics Concern  . Not on file   Social History Narrative  . No narrative on file    Past Surgical History  Procedure Laterality Date  . Tubal ligation  2010    Family History  Problem Relation Age of Onset  . Arthritis Maternal Grandmother   . Hearing loss Maternal Grandmother   . Arthritis Maternal Grandfather   . Hearing loss Maternal Grandfather     No Known Allergies  No current outpatient prescriptions on file prior to visit.   No current facility-administered medications on file prior to visit.    BP 120/60  Pulse 104  Ht 5\' 6"  (1.676 m)  Wt 172 lb (78.019 kg)  BMI 27.77 kg/m2  LMP 09/14/2015chart    Objective:   Physical Exam  Constitutional: She is oriented to person, place, and time. She appears well-developed and well-nourished.  HENT:  Head: Normocephalic.    Right Ear: External ear normal.  Left Ear: External ear normal.  Nose: Nose normal.  Mouth/Throat: Oropharynx is clear and moist.  Eyes: Conjunctivae and EOM are normal. Pupils are equal, round, and reactive to light.  Neck: Normal range of motion. Neck supple.  Cardiovascular: Normal rate, regular rhythm and normal heart sounds.   Pulmonary/Chest: Effort normal and breath sounds normal.  Abdominal: Soft. Bowel sounds are normal.  Musculoskeletal: Normal range of motion.  Neurological: She is alert and oriented to person, place, and time. She has normal reflexes.  Skin: Skin is warm and dry.  Psychiatric: She has a normal mood and affect.          Assessment & Plan:  Sarah Leach was seen today for annual exam.  Diagnoses and associated orders for this visit:  Preventative health care  Encounter for immunization  Pure hypercholesterolemia    Encouraged healthy diet and exercise. Low-cholesterol intake.

## 2014-12-13 ENCOUNTER — Ambulatory Visit (INDEPENDENT_AMBULATORY_CARE_PROVIDER_SITE_OTHER): Payer: BLUE CROSS/BLUE SHIELD | Admitting: Family Medicine

## 2014-12-13 ENCOUNTER — Encounter: Payer: Self-pay | Admitting: Family Medicine

## 2014-12-13 ENCOUNTER — Ambulatory Visit: Payer: Self-pay | Admitting: Family Medicine

## 2014-12-13 VITALS — BP 116/68 | HR 62 | Temp 97.3°F | Ht 66.0 in | Wt 173.3 lb

## 2014-12-13 DIAGNOSIS — D649 Anemia, unspecified: Secondary | ICD-10-CM

## 2014-12-13 DIAGNOSIS — M545 Low back pain, unspecified: Secondary | ICD-10-CM

## 2014-12-13 LAB — URINALYSIS, MICROSCOPIC ONLY

## 2014-12-13 LAB — BASIC METABOLIC PANEL
BUN: 9 mg/dL (ref 6–23)
CALCIUM: 8.6 mg/dL (ref 8.4–10.5)
CO2: 28 meq/L (ref 19–32)
CREATININE: 0.85 mg/dL (ref 0.40–1.20)
Chloride: 108 mEq/L (ref 96–112)
GFR: 81.45 mL/min (ref >60.00)
GLUCOSE: 90 mg/dL (ref 70–99)
Potassium: 4.7 mEq/L (ref 3.5–5.1)
SODIUM: 141 meq/L (ref 135–145)

## 2014-12-13 LAB — CBC WITH DIFFERENTIAL/PLATELET
BASOS ABS: 0 10*3/uL (ref 0.0–0.1)
Basophils Relative: 0.5 % (ref 0.0–3.0)
EOS PCT: 1.2 % (ref 0.0–5.0)
Eosinophils Absolute: 0.1 10*3/uL (ref 0.0–0.7)
HCT: 36.8 % (ref 36.0–46.0)
Hemoglobin: 12.5 g/dL (ref 12.0–15.0)
LYMPHS ABS: 1.8 10*3/uL (ref 0.7–4.0)
LYMPHS PCT: 24.3 % (ref 12.0–46.0)
MCHC: 34 g/dL (ref 30.0–36.0)
MCV: 85.7 fl (ref 78.0–100.0)
MONOS PCT: 6.2 % (ref 3.0–12.0)
Monocytes Absolute: 0.5 10*3/uL (ref 0.1–1.0)
NEUTROS PCT: 67.8 % (ref 43.0–77.0)
Neutro Abs: 5.1 10*3/uL (ref 1.4–7.7)
PLATELETS: 352 10*3/uL (ref 150.0–400.0)
RBC: 4.29 Mil/uL (ref 3.87–5.11)
RDW: 14.6 % (ref 11.5–15.5)
WBC: 7.6 10*3/uL (ref 4.0–10.5)

## 2014-12-13 LAB — IBC PANEL
IRON: 148 ug/dL — AB (ref 42–145)
SATURATION RATIOS: 43.9 % (ref 20.0–50.0)
TRANSFERRIN: 241 mg/dL (ref 212.0–360.0)

## 2014-12-13 MED ORDER — CYCLOBENZAPRINE HCL 5 MG PO TABS: 5.0000 mg | ORAL_TABLET | Freq: Three times a day (TID) | ORAL | Status: DC | PRN

## 2014-12-13 NOTE — Patient Instructions (Signed)
BEFORE YOU LEAVE: -labs -new patient visit in 1 month (30 minutes) -low back home exercises  Heat for 15 minutes twice daily  Try topical sports creams  Tylenol 500-1000mg  up to 3 times daiy for pain if needed  Flexeril nightly for 1 week

## 2014-12-13 NOTE — Addendum Note (Signed)
Addended by: Elmer Picker on: 12/13/2014 10:43 AM   Modules accepted: Orders

## 2014-12-13 NOTE — Progress Notes (Signed)
HPI: Sarah Leach is a 34 yo patient of [name] here for an acute visit for:  Low Back: -started several months ago, pretty constant -bilat low back pain, severe at times, worse the last few weeks -works [employer], worse with being on her feet for a long time -tylenol and ibuprofen did not help much -ice helps -walks about 2-3 miles 3 days per week -denies: fevers, malaise, dysuria, bowel changes, trauma, weight loss -FDLMP: 2 weeks ago - period heavy -she has anemia on records, she reports she did not know that she had this  ROS: See pertinent positives and negatives per HPI.  No past medical history on file.  Past Surgical History  Procedure Laterality Date  . Tubal ligation  2010    Family History  Problem Relation Age of Onset  . Arthritis Maternal Grandmother   . Hearing loss Maternal Grandmother   . Arthritis Maternal Grandfather   . Hearing loss Maternal Grandfather     History   Social History  . Marital Status: Married    Spouse Name: N/A  . Number of Children: N/A  . Years of Education: N/A   Social History Main Topics  . Smoking status: Never Smoker   . Smokeless tobacco: Never Used  . Alcohol Use: No  . Drug Use: No  . Sexual Activity: Yes    Birth Control/ Protection: None   Other Topics Concern  . None   Social History Narrative     Current outpatient prescriptions:  .  cyclobenzaprine (FLEXERIL) 5 MG tablet, Take 1 tablet (5 mg total) by mouth 3 (three) times daily as needed for muscle spasms., Disp: 20 tablet, Rfl: 0  EXAM:  Filed Vitals:   12/13/14 0853  BP: 116/68  Pulse: 62  Temp: 97.3 F (36.3 C)    Body mass index is 27.98 kg/(m^2).  GENERAL: vitals reviewed and listed above, alert, oriented, appears well hydrated and in no acute distress  HEENT: atraumatic, conjunttiva clear, no obvious abnormalities on inspection of external nose and ears  NECK: no obvious masses on inspection  LUNGS: clear to auscultation  bilaterally, no wheezes, rales or rhonchi, good air movement  CV: HRRR, no peripheral edema  MS: moves all extremities without noticeable abnormality Gait normal normal inspection of back and legs TTP of even very light touch in bilat lumbar paraspinal muscles, along both iliac crests, LLQ abd, lat hips, behind knees No bony TTP Normal strength, sensation to light touch and DTRs bilat LE throughout, no clonus Pain in lat hips with FABER and FADIR  PSYCH: pleasant and cooperative, no obvious depression or anxiety  ASSESSMENT AND PLAN:  Discussed the following assessment and plan:  Bilateral low back pain without sciatica - Plan: CBC With Differential, IBC Panel, Basic metabolic panel, Urine Microscopic Only, DG Lumbar Spine Complete, cyclobenzaprine (FLEXERIL) 5 MG tablet  Anemia, unspecified anemia type - Plan: CBC With Differential, IBC Panel, Basic metabolic panel, Urine Microscopic Only, DG Lumbar Spine Complete  -we discussed possible serious and likely etiologies, workup and treatment, treatment risks and return precautions - query likely musculoskeletal even fibromyalgia, but with sig pain and hx anemia unspecified opted for some labs today  -after this discussion, Sarah Leach opted for flexeril, supportive care, HEP -follow up advised in 1 month -of course, we advised Sarah Leach  to return or notify a doctor immediately if symptoms worsen or persist or new concerns arise.  .  -Patient advised to return or notify a doctor immediately if symptoms worsen  or persist or new concerns arise.  There are no Patient Instructions on file for this visit.   Colin Benton R.

## 2014-12-13 NOTE — Progress Notes (Signed)
Pre visit review using our clinic review tool, if applicable. No additional management support is needed unless otherwise documented below in the visit note. 

## 2014-12-16 ENCOUNTER — Other Ambulatory Visit: Payer: Self-pay | Admitting: *Deleted

## 2014-12-16 DIAGNOSIS — R8290 Unspecified abnormal findings in urine: Secondary | ICD-10-CM

## 2014-12-20 ENCOUNTER — Other Ambulatory Visit (INDEPENDENT_AMBULATORY_CARE_PROVIDER_SITE_OTHER): Payer: BLUE CROSS/BLUE SHIELD

## 2014-12-20 DIAGNOSIS — R8290 Unspecified abnormal findings in urine: Secondary | ICD-10-CM

## 2014-12-23 LAB — URINE CULTURE

## 2014-12-23 MED ORDER — CIPROFLOXACIN HCL 500 MG PO TABS: 500.0000 mg | ORAL_TABLET | Freq: Two times a day (BID) | ORAL | Status: DC

## 2014-12-23 NOTE — Addendum Note (Signed)
Addended by: Agnes Lawrence on: 12/23/2014 01:26 PM   Modules accepted: Orders

## 2015-01-07 ENCOUNTER — Ambulatory Visit: Payer: BLUE CROSS/BLUE SHIELD | Admitting: Family Medicine

## 2015-01-16 ENCOUNTER — Ambulatory Visit (INDEPENDENT_AMBULATORY_CARE_PROVIDER_SITE_OTHER): Payer: BLUE CROSS/BLUE SHIELD | Admitting: Family Medicine

## 2015-01-16 ENCOUNTER — Encounter: Payer: Self-pay | Admitting: Family Medicine

## 2015-01-16 VITALS — BP 116/72 | HR 73 | Temp 97.8°F | Ht 66.0 in | Wt 173.4 lb

## 2015-01-16 DIAGNOSIS — R51 Headache: Secondary | ICD-10-CM

## 2015-01-16 DIAGNOSIS — Z7689 Persons encountering health services in other specified circumstances: Secondary | ICD-10-CM

## 2015-01-16 DIAGNOSIS — E7800 Pure hypercholesterolemia, unspecified: Secondary | ICD-10-CM

## 2015-01-16 DIAGNOSIS — Z7189 Other specified counseling: Secondary | ICD-10-CM | POA: Diagnosis not present

## 2015-01-16 DIAGNOSIS — M170 Bilateral primary osteoarthritis of knee: Secondary | ICD-10-CM

## 2015-01-16 DIAGNOSIS — M1990 Unspecified osteoarthritis, unspecified site: Secondary | ICD-10-CM

## 2015-01-16 DIAGNOSIS — R519 Headache, unspecified: Secondary | ICD-10-CM

## 2015-01-16 HISTORY — DX: Unspecified osteoarthritis, unspecified site: M19.90

## 2015-01-16 HISTORY — DX: Headache, unspecified: R51.9

## 2015-01-16 NOTE — Progress Notes (Signed)
HPI:  Sarah Leach is here to establish care. She used to see Sarah Leach. She was here recently for an acute visit for back pain and was found to have a UTI which was treated. Last PCP and physical: 07/2014 with Sarah Leach  Has the following chronic problems that require follow up and concerns today:  Overweight: -walks to work daily - about 20 minutes -diet is healthy per her report - eats [employer]  Headaches: -once per month with periods -take ibuprofen -denies change, worsening, hx migraines   Mild OA: -bilateral knees -not causing her issues  Mild Hyperlipidemia: -has not needed medications for this in the past  ROS negative for unless reported above: fevers, unintentional weight loss, hearing or vision loss, chest pain, palpitations, struggling to breath, hemoptysis, melena, hematochezia, hematuria, falls, loc, si, thoughts of self harm  Past Medical History  Diagnosis Date  . Pure hypercholesterolemia 08/16/2014  . Osteoarthritis of knees 01/16/2015  . Bilateral headaches 01/16/2015    -chronic -associated with her menstrual periods   . Anemia     resolved, mild for some time - does give plasma twice per week which is likely etiology    Past Surgical History  Procedure Laterality Date  . Tubal ligation  2010    Family History  Problem Relation Age of Onset  . Arthritis Maternal Grandmother   . Hearing loss Maternal Grandmother   . Arthritis Maternal Grandfather   . Hearing loss Maternal Grandfather     History   Social History  . Marital Status: Married    Spouse Name: N/A  . Number of Children: N/A  . Years of Education: N/A   Social History Main Topics  . Smoking status: Never Smoker   . Smokeless tobacco: Never Used  . Alcohol Use: No  . Drug Use: No  . Sexual Activity: Yes    Birth Control/ Protection: None   Other Topics Concern  . None   Social History Narrative   Work or School: works at [employer] Situation: lives with  husband, 4 children - none of them live with them      Spiritual Beliefs: Christian      Lifestyle: walks to work daily - 20 minutes; diet is ok - [place] [employer]          No current outpatient prescriptions on file.  EXAM:  Filed Vitals:   01/16/15 1049  BP: 116/72  Pulse: 73  Temp: 97.8 F (36.6 C)    Body mass index is 28 kg/(m^2).  GENERAL: vitals reviewed and listed above, alert, oriented, appears well hydrated and in no acute distress  HEENT: atraumatic, conjunttiva clear, no obvious abnormalities on inspection of external nose and ears  NECK: no obvious masses on inspection  LUNGS: clear to auscultation bilaterally, no wheezes, rales or rhonchi, good air movement  CV: HRRR, no peripheral edema  MS: moves all extremities without noticeable abnormality  PSYCH: pleasant and cooperative, no obvious depression or anxiety  ASSESSMENT AND PLAN:  Discussed the following assessment and plan:  Bilateral headaches  Osteoarthritis of both knees, unspecified osteoarthritis type  Pure hypercholesterolemia  Encounter to establish care  -We reviewed the PMH, PSH, FH, SH, Meds and Allergies. -We provided refills for any medications we will prescribe as needed. -We addressed current concerns per orders and patient instructions. -We have asked for records for pertinent exams, studies, vaccines and notes from previous providers. -We have advised patient to follow  up per instructions below. -recommended healthy diet and iron rich foods -advised regular exercise -follow up in October for physical  -Patient advised to return or notify a doctor immediately if symptoms worsen or persist or new concerns arise.  There are no Patient Instructions on file for this visit.   Sarah Leach R.

## 2015-01-16 NOTE — Progress Notes (Signed)
Pre visit review using our clinic review tool, if applicable. No additional management support is needed unless otherwise documented below in the visit note. 

## 2015-03-04 ENCOUNTER — Ambulatory Visit: Payer: BLUE CROSS/BLUE SHIELD | Admitting: Family Medicine

## 2015-03-04 ENCOUNTER — Encounter: Payer: Self-pay | Admitting: Family Medicine

## 2015-03-04 ENCOUNTER — Ambulatory Visit (INDEPENDENT_AMBULATORY_CARE_PROVIDER_SITE_OTHER): Payer: BLUE CROSS/BLUE SHIELD | Admitting: Family Medicine

## 2015-03-04 VITALS — BP 120/78 | HR 76 | Temp 98.0°F | Ht 66.0 in | Wt 170.1 lb

## 2015-03-04 DIAGNOSIS — B029 Zoster without complications: Secondary | ICD-10-CM

## 2015-03-04 MED ORDER — VALACYCLOVIR HCL 1 G PO TABS: 1000.0000 mg | ORAL_TABLET | Freq: Three times a day (TID) | ORAL | Status: DC

## 2015-03-04 NOTE — Progress Notes (Signed)
Pre visit review using our clinic review tool, if applicable. No additional management support is needed unless otherwise documented below in the visit note. 

## 2015-03-04 NOTE — Progress Notes (Signed)
  HPI:  Acute visit for:  Rash: -started 2 days ago -painful rash on L flank and back - burning pain -did have chicken pox as a child -denies: HA, fevers, malaise  ROS: See pertinent positives and negatives per HPI.  Past Medical History  Diagnosis Date  . Pure hypercholesterolemia 08/16/2014  . Osteoarthritis of knees 01/16/2015  . Bilateral headaches 01/16/2015    -chronic -associated with her menstrual periods   . Anemia     resolved, mild for some time - does give plasma twice per week which is likely etiology    Past Surgical History  Procedure Laterality Date  . Tubal ligation  2010    Family History  Problem Relation Age of Onset  . Arthritis Maternal Grandmother   . Hearing loss Maternal Grandmother   . Arthritis Maternal Grandfather   . Hearing loss Maternal Grandfather     History   Social History  . Marital Status: Married    Spouse Name: N/A  . Number of Children: N/A  . Years of Education: N/A   Social History Main Topics  . Smoking status: Never Smoker   . Smokeless tobacco: Never Used  . Alcohol Use: No  . Drug Use: No  . Sexual Activity: Yes    Birth Control/ Protection: None   Other Topics Concern  . None   Social History Narrative   Work or School: works at [employer] Situation: lives with husband, 4 children - none of them live with them      Spiritual Beliefs: Christian      Lifestyle: walks to work daily - 20 minutes; diet is ok - [place] [employer]           Current outpatient prescriptions:  .  valACYclovir (VALTREX) 1000 MG tablet, Take 1 tablet (1,000 mg total) by mouth 3 (three) times daily., Disp: 21 tablet, Rfl: 0  EXAM:  Filed Vitals:   03/04/15 1247  BP: 120/78  Pulse: 76  Temp: 98 F (36.7 C)    Body mass index is 27.47 kg/(m^2).  GENERAL: vitals reviewed and listed above, alert, oriented, appears well hydrated and in no acute distress  HEENT: atraumatic, conjunttiva clear, no obvious  abnormalities on inspection of external nose and ears  NECK: no obvious masses on inspection  LUNGS: clear to auscultation bilaterally, no wheezes, rales or rhonchi, good air movement  CV: HRRR, no peripheral edema  SKIN: erythematous papulovesicular rash on L flank and back in dermatomal pattern  MS: moves all extremities without noticeable abnormality  PSYCH: pleasant and cooperative, no obvious depression or anxiety  ASSESSMENT AND PLAN:  Discussed the following assessment and plan:  Shingles  -start antiviral -pain control with tylenol or aleve -return precuations -Patient advised to return or notify a doctor immediately if symptoms worsen or persist or new concerns arise.  There are no Patient Instructions on file for this visit.   Colin Benton R.

## 2015-03-04 NOTE — Patient Instructions (Signed)
Start the valtrx (antiviral medication) today  Shingles Shingles (herpes zoster) is an infection that is caused by the same virus that causes chickenpox (varicella). The infection causes a painful skin rash and fluid-filled blisters, which eventually break open, crust over, and heal. It may occur in any area of the body, but it usually affects only one side of the body or face. The pain of shingles usually lasts about 1 month. However, some people with shingles may develop long-term (chronic) pain in the affected area of the body. Shingles often occurs many years after the person had chickenpox. It is more common:  In people older than 50 years.  In people with weakened immune systems, such as those with HIV, AIDS, or cancer.  In people taking medicines that weaken the immune system, such as transplant medicines.  In people under great stress. CAUSES  Shingles is caused by the varicella zoster virus (VZV), which also causes chickenpox. After a person is infected with the virus, it can remain in the person's body for years in an inactive state (dormant). To cause shingles, the virus reactivates and breaks out as an infection in a nerve root. The virus can be spread from person to person (contagious) through contact with open blisters of the shingles rash. It will only spread to people who have not had chickenpox. When these people are exposed to the virus, they may develop chickenpox. They will not develop shingles. Once the blisters scab over, the person is no longer contagious and cannot spread the virus to others. SIGNS AND SYMPTOMS  Shingles shows up in stages. The initial symptoms may be pain, itching, and tingling in an area of the skin. This pain is usually described as burning, stabbing, or throbbing.In a few days or weeks, a painful red rash will appear in the area where the pain, itching, and tingling were felt. The rash is usually on one side of the body in a band or belt-like pattern.  Then, the rash usually turns into fluid-filled blisters. They will scab over and dry up in approximately 2-3 weeks. Flu-like symptoms may also occur with the initial symptoms, the rash, or the blisters. These may include:  Fever.  Chills.  Headache.  Upset stomach. DIAGNOSIS  Your health care provider will perform a skin exam to diagnose shingles. Skin scrapings or fluid samples may also be taken from the blisters. This sample will be examined under a microscope or sent to a lab for further testing. TREATMENT  There is no specific cure for shingles. Your health care provider will likely prescribe medicines to help you manage the pain, recover faster, and avoid long-term problems. This may include antiviral drugs, anti-inflammatory drugs, and pain medicines. HOME CARE INSTRUCTIONS   Take a cool bath or apply cool compresses to the area of the rash or blisters as directed. This may help with the pain and itching.   Take medicines only as directed by your health care provider.   Rest as directed by your health care provider.  Keep your rash and blisters clean with mild soap and cool water or as directed by your health care provider.  Do not pick your blisters or scratch your rash. Apply an anti-itch cream or numbing creams to the affected area as directed by your health care provider.  Keep your shingles rash covered with a loose bandage (dressing).  Avoid skin contact with:  Babies.   Pregnant women.   Children with eczema.   Elderly people with transplants.  People with chronic illnesses, such as leukemia or AIDS.   Wear loose-fitting clothing to help ease the pain of material rubbing against the rash.  Keep all follow-up visits as directed by your health care provider.If the area involved is on your face, you may receive a referral for a specialist, such as an eye doctor (ophthalmologist) or an ear, nose, and throat (ENT) doctor. Keeping all follow-up visits will  help you avoid eye problems, chronic pain, or disability.  SEEK IMMEDIATE MEDICAL CARE IF:   You have facial pain, pain around the eye area, or loss of feeling on one side of your face.  You have ear pain or ringing in your ear.  You have loss of taste.  Your pain is not relieved with prescribed medicines.   Your redness or swelling spreads.   You have more pain and swelling.  Your condition is worsening or has changed.   You have a fever. MAKE SURE YOU:  Understand these instructions.  Will watch your condition.  Will get help right away if you are not doing well or get worse. Document Released: 10/04/2005 Document Revised: 02/18/2014 Document Reviewed: 05/18/2012 [facility] Patient Information 2015 [place], [place]. This information is not intended to replace advice given to you by your health care provider. Make sure you discuss any questions you have with your health care provider.

## 2015-06-13 ENCOUNTER — Telehealth (HOSPITAL_COMMUNITY): Payer: Self-pay | Admitting: *Deleted

## 2015-06-13 NOTE — Telephone Encounter (Signed)
Opened chart in error. Entered wrong MRN, was 1 number different from [facility] patient.

## 2015-07-28 ENCOUNTER — Encounter: Payer: Self-pay | Admitting: Family Medicine

## 2015-07-28 ENCOUNTER — Ambulatory Visit (INDEPENDENT_AMBULATORY_CARE_PROVIDER_SITE_OTHER): Payer: Self-pay | Admitting: Family Medicine

## 2015-07-28 VITALS — BP 98/68 | HR 73 | Temp 98.1°F | Ht 66.0 in | Wt 164.6 lb

## 2015-07-28 DIAGNOSIS — J069 Acute upper respiratory infection, unspecified: Secondary | ICD-10-CM

## 2015-07-28 NOTE — Progress Notes (Signed)
Pre visit review using our clinic review tool, if applicable. No additional management support is needed unless otherwise documented below in the visit note. 

## 2015-07-28 NOTE — Patient Instructions (Signed)

## 2015-07-28 NOTE — Progress Notes (Signed)
HPI:  URI: -started: about 5-6 days ago -symptoms:nasal congestion, sore throat, cough, PND, some sinus pain initially that cleared - now with persistent cough -denies:fever, SOB, NVD, tooth pain -has tried: musinex -sick contacts/travel/risks: denies flu exposure, tick exposure or or Ebola risks  ROS: See pertinent positives and negatives per HPI.  Past Medical History  Diagnosis Date  . Pure hypercholesterolemia 08/16/2014  . Osteoarthritis of knees 01/16/2015  . Bilateral headaches 01/16/2015    -chronic -associated with her menstrual periods   . Anemia     resolved, mild for some time - does give plasma twice per week which is likely etiology    Past Surgical History  Procedure Laterality Date  . Tubal ligation  2010    Family History  Problem Relation Age of Onset  . Arthritis Maternal Grandmother   . Hearing loss Maternal Grandmother   . Arthritis Maternal Grandfather   . Hearing loss Maternal Grandfather     Social History   Social History  . Marital Status: Married    Spouse Name: N/A  . Number of Children: N/A  . Years of Education: N/A   Social History Main Topics  . Smoking status: Never Smoker   . Smokeless tobacco: Never Used  . Alcohol Use: No  . Drug Use: No  . Sexual Activity: Yes    Birth Control/ Protection: None   Other Topics Concern  . None   Social History Narrative   Work or School: works at [employer] Situation: lives with husband, 4 children - none of them live with them      Spiritual Beliefs: Christian      Lifestyle: walks to work daily - 20 minutes; diet is ok - [place] [employer]          No current outpatient prescriptions on file.  EXAM:  Filed Vitals:   07/28/15 1535  BP: 98/68  Pulse: 73  Temp: 98.1 F (36.7 C)    Body mass index is 26.58 kg/(m^2).  GENERAL: vitals reviewed and listed above, alert, oriented, appears well hydrated and in no acute distress  HEENT: atraumatic, conjunttiva clear,  no obvious abnormalities on inspection of external nose and ears, normal appearance of ear canals and TMs, clear nasal congestion, mild post oropharyngeal erythema with PND, no tonsillar edema or exudate, no sinus TTP  NECK: no obvious masses on inspection  LUNGS: clear to auscultation bilaterally, no wheezes, rales or rhonchi, good air movement  CV: HRRR, no peripheral edema  MS: moves all extremities without noticeable abnormality  PSYCH: pleasant and cooperative, no obvious depression or anxiety  ASSESSMENT AND PLAN:  Discussed the following assessment and plan:  Viral upper respiratory infection  -given HPI and exam findings today, a serious infection or illness is unlikely. We discussed potential etiologies, with VURI being most likely, and advised supportive care and monitoring. We discussed treatment side effects, likely course, antibiotic misuse, transmission, and signs of developing a serious illness. -of course, we advised to return or notify a doctor immediately if symptoms worsen or persist or new concerns arise.    Patient Instructions  INSTRUCTIONS FOR UPPER RESPIRATORY INFECTION:  -plenty of rest and fluids  -nasal saline wash 2-3 times daily (use prepackaged nasal saline or bottled/distilled water if making your own)   -can use AFRIN nasal spray for drainage and nasal congestion - but do NOT use longer then 3-4 days  -can use tylenol (in no history of liver  disease) or ibuprofen (if no history of kidney disease, bowel bleeding or significant heart disease) as directed for aches and sorethroat  -in the winter time, using a humidifier at night is helpful (please follow cleaning instructions)  -if you are taking a cough medication - use only as directed, may also try a teaspoon of honey to coat the throat and throat lozenges. If given a cough medication with codeine or hydrocodone or other narcotic please be advised that this contains a strong and  potentially  addicting medication. Please follow instructions carefully, take as little as possible and only use AS NEEDED for severe cough. Discuss potential side effects with your pharmacy. Please do not drive or operate machinery while taking these types of medications. Please do not take other sedating medications, drugs or alcohol while taking this medication without discussing with your doctor.  -for sore throat, salt water gargles can help  -follow up if you have fevers, facial pain, tooth pain, difficulty breathing or are worsening or symptoms persist longer then expected  Upper Respiratory Infection, Adult An upper respiratory infection (URI) is also known as the common cold. It is often caused by a type of germ (virus). Colds are easily spread (contagious). You can pass it to others by kissing, coughing, sneezing, or drinking out of the same glass. Usually, you get better in 1 to 3  weeks.  However, the cough can last for even longer. HOME CARE   Only take medicine as told by your doctor. Follow instructions provided above.  Drink enough water and fluids to keep your pee (urine) clear or pale yellow.  Get plenty of rest.  Return to work when your temperature is < 100 for 24 hours or as told by your doctor. You may use a face mask and wash your hands to stop your cold from spreading. GET HELP RIGHT AWAY IF:   After the first few days, you feel you are getting worse.  You have questions about your medicine.  You have chills, shortness of breath, or red spit (mucus).  You have pain in the face for more then 1-2 days, especially when you bend forward.  You have a fever, puffy (swollen) neck, pain when you swallow, or white spots in the back of your throat.  You have a bad headache, ear pain, sinus pain, or chest pain.  You have a high-pitched whistling sound when you breathe in and out (wheezing).  You cough up blood.  You have sore muscles or a stiff neck. MAKE SURE YOU:   Understand  these instructions.  Will watch your condition.  Will get help right away if you are not doing well or get worse. Document Released: 03/22/2008 Document Revised: 12/27/2011 Document Reviewed: 01/09/2014 [facility] Patient Information 2015 [place], [place]. This information is not intended to replace advice given to you by your health care provider. Make sure you discuss any questions you have with your health care provider.      Colin Benton R.

## 2015-08-07 ENCOUNTER — Ambulatory Visit: Payer: Self-pay

## 2015-08-22 ENCOUNTER — Ambulatory Visit (INDEPENDENT_AMBULATORY_CARE_PROVIDER_SITE_OTHER): Payer: Self-pay | Admitting: Family Medicine

## 2015-08-22 DIAGNOSIS — R69 Illness, unspecified: Secondary | ICD-10-CM

## 2015-08-22 NOTE — Progress Notes (Signed)
No show

## 2015-09-29 ENCOUNTER — Encounter (HOSPITAL_COMMUNITY): Payer: Self-pay | Admitting: Emergency Medicine

## 2015-09-29 ENCOUNTER — Emergency Department (HOSPITAL_COMMUNITY): Payer: Self-pay

## 2015-09-29 ENCOUNTER — Telehealth: Payer: Self-pay | Admitting: Family Medicine

## 2015-09-29 ENCOUNTER — Inpatient Hospital Stay (HOSPITAL_COMMUNITY)
Admission: EM | Admit: 2015-09-29 | Discharge: 2015-10-03 | DRG: 176 | Disposition: A | Discharge status: Home / Self Care | Payer: Self-pay | Attending: Pulmonary Disease | Admitting: Pulmonary Disease

## 2015-09-29 DIAGNOSIS — R079 Chest pain, unspecified: Secondary | ICD-10-CM

## 2015-09-29 DIAGNOSIS — R072 Precordial pain: Secondary | ICD-10-CM | POA: Diagnosis present

## 2015-09-29 DIAGNOSIS — R0600 Dyspnea, unspecified: Secondary | ICD-10-CM

## 2015-09-29 DIAGNOSIS — I82441 Acute embolism and thrombosis of right tibial vein: Secondary | ICD-10-CM | POA: Diagnosis present

## 2015-09-29 DIAGNOSIS — Z8261 Family history of arthritis: Secondary | ICD-10-CM

## 2015-09-29 DIAGNOSIS — E785 Hyperlipidemia, unspecified: Secondary | ICD-10-CM | POA: Diagnosis present

## 2015-09-29 DIAGNOSIS — I82431 Acute embolism and thrombosis of right popliteal vein: Secondary | ICD-10-CM | POA: Diagnosis present

## 2015-09-29 DIAGNOSIS — E876 Hypokalemia: Secondary | ICD-10-CM | POA: Diagnosis present

## 2015-09-29 DIAGNOSIS — R339 Retention of urine, unspecified: Secondary | ICD-10-CM | POA: Diagnosis present

## 2015-09-29 DIAGNOSIS — R748 Abnormal levels of other serum enzymes: Secondary | ICD-10-CM | POA: Diagnosis present

## 2015-09-29 DIAGNOSIS — R0902 Hypoxemia: Secondary | ICD-10-CM | POA: Diagnosis present

## 2015-09-29 DIAGNOSIS — I82411 Acute embolism and thrombosis of right femoral vein: Secondary | ICD-10-CM | POA: Diagnosis present

## 2015-09-29 DIAGNOSIS — R51 Headache: Secondary | ICD-10-CM | POA: Diagnosis present

## 2015-09-29 DIAGNOSIS — I2609 Other pulmonary embolism with acute cor pulmonale: Secondary | ICD-10-CM

## 2015-09-29 DIAGNOSIS — E7800 Pure hypercholesterolemia, unspecified: Secondary | ICD-10-CM | POA: Diagnosis present

## 2015-09-29 DIAGNOSIS — M170 Bilateral primary osteoarthritis of knee: Secondary | ICD-10-CM | POA: Diagnosis present

## 2015-09-29 DIAGNOSIS — I214 Non-ST elevation (NSTEMI) myocardial infarction: Secondary | ICD-10-CM

## 2015-09-29 DIAGNOSIS — I2699 Other pulmonary embolism without acute cor pulmonale: Principal | ICD-10-CM | POA: Diagnosis present

## 2015-09-29 DIAGNOSIS — N390 Urinary tract infection, site not specified: Secondary | ICD-10-CM | POA: Diagnosis present

## 2015-09-29 DIAGNOSIS — I82491 Acute embolism and thrombosis of other specified deep vein of right lower extremity: Secondary | ICD-10-CM | POA: Diagnosis present

## 2015-09-29 HISTORY — DX: Other pulmonary embolism without acute cor pulmonale: I26.99

## 2015-09-29 LAB — URINE MICROSCOPIC-ADD ON

## 2015-09-29 LAB — URINALYSIS, ROUTINE W REFLEX MICROSCOPIC
GLUCOSE, UA: NEGATIVE mg/dL
Ketones, ur: 15 mg/dL — AB
Leukocytes, UA: NEGATIVE
NITRITE: POSITIVE — AB
Protein, ur: NEGATIVE mg/dL
SPECIFIC GRAVITY, URINE: 1.029 (ref 1.005–1.030)
pH: 5.5 (ref 5.0–8.0)

## 2015-09-29 LAB — I-STAT TROPONIN, ED
TROPONIN I, POC: 0.14 ng/mL — AB (ref 0.00–0.08)
Troponin i, poc: 0.15 ng/mL (ref 0.00–0.08)

## 2015-09-29 LAB — BASIC METABOLIC PANEL
Anion gap: 9 (ref 5–15)
BUN: 10 mg/dL (ref 6–20)
CHLORIDE: 110 mmol/L (ref 101–111)
CO2: 23 mmol/L (ref 22–32)
Calcium: 8.8 mg/dL — ABNORMAL LOW (ref 8.9–10.3)
Creatinine, Ser: 0.93 mg/dL (ref 0.44–1.00)
Glucose, Bld: 107 mg/dL — ABNORMAL HIGH (ref 65–99)
POTASSIUM: 2.9 mmol/L — AB (ref 3.5–5.1)
SODIUM: 142 mmol/L (ref 135–145)

## 2015-09-29 LAB — CBC
HEMATOCRIT: 39.7 % (ref 36.0–46.0)
Hemoglobin: 13.2 g/dL (ref 12.0–15.0)
MCH: 28.7 pg (ref 26.0–34.0)
MCHC: 33.2 g/dL (ref 30.0–36.0)
MCV: 86.3 fL (ref 78.0–100.0)
PLATELETS: 303 10*3/uL (ref 150–400)
RBC: 4.6 MIL/uL (ref 3.87–5.11)
RDW: 13.5 % (ref 11.5–15.5)
WBC: 8.3 10*3/uL (ref 4.0–10.5)

## 2015-09-29 LAB — RAPID URINE DRUG SCREEN, HOSP PERFORMED
AMPHETAMINES: NOT DETECTED
Barbiturates: NOT DETECTED
Benzodiazepines: NOT DETECTED
Cocaine: NOT DETECTED
Opiates: NOT DETECTED
TETRAHYDROCANNABINOL: NOT DETECTED

## 2015-09-29 LAB — POC URINE PREG, ED: PREG TEST UR: NEGATIVE

## 2015-09-29 LAB — PHOSPHORUS: PHOSPHORUS: 3.1 mg/dL (ref 2.5–4.6)

## 2015-09-29 LAB — MRSA PCR SCREENING: MRSA BY PCR: NEGATIVE

## 2015-09-29 LAB — TROPONIN I: TROPONIN I: 0.14 ng/mL — AB (ref <0.031)

## 2015-09-29 LAB — LACTIC ACID, PLASMA: LACTIC ACID, VENOUS: 1.3 mmol/L (ref 0.5–2.0)

## 2015-09-29 LAB — MAGNESIUM: Magnesium: 2 mg/dL (ref 1.7–2.4)

## 2015-09-29 MED ORDER — SODIUM CHLORIDE 0.9 % IJ SOLN
3.0000 mL | Freq: Two times a day (BID) | INTRAMUSCULAR | Status: DC
  Administered 2015-09-29 – 2015-10-03 (×8): 3 mL via INTRAVENOUS

## 2015-09-29 MED ORDER — ONDANSETRON HCL 4 MG/2ML IJ SOLN
4.0000 mg | Freq: Four times a day (QID) | INTRAMUSCULAR | Status: DC | PRN
  Administered 2015-09-30 – 2015-10-01 (×2): 4 mg via INTRAVENOUS
  Filled 2015-09-29 (×2): qty 2

## 2015-09-29 MED ORDER — ONDANSETRON HCL 4 MG PO TABS: 4.0000 mg | ORAL_TABLET | Freq: Four times a day (QID) | ORAL | Status: DC | PRN

## 2015-09-29 MED ORDER — INFLUENZA VAC SPLIT QUAD 0.5 ML IM SUSY
0.5000 mL | PREFILLED_SYRINGE | INTRAMUSCULAR | Status: AC
  Administered 2015-10-01: 0.5 mL via INTRAMUSCULAR

## 2015-09-29 MED ORDER — HEPARIN (PORCINE) IN NACL 100-0.45 UNIT/ML-% IJ SOLN
1100.0000 [IU]/h | INTRAMUSCULAR | Status: DC
  Administered 2015-09-29 – 2015-09-30 (×2): 1200 [IU]/h via INTRAVENOUS
  Administered 2015-10-01: 1100 [IU]/h via INTRAVENOUS
  Filled 2015-09-29 (×3): qty 250

## 2015-09-29 MED ORDER — PANTOPRAZOLE SODIUM 40 MG PO TBEC
40.0000 mg | DELAYED_RELEASE_TABLET | Freq: Every day | ORAL | Status: DC
  Administered 2015-09-29 – 2015-10-03 (×5): 40 mg via ORAL
  Filled 2015-09-29 (×5): qty 1

## 2015-09-29 MED ORDER — NITROFURANTOIN MACROCRYSTAL 100 MG PO CAPS
100.0000 mg | ORAL_CAPSULE | Freq: Two times a day (BID) | ORAL | Status: DC
  Administered 2015-09-29 – 2015-10-03 (×8): 100 mg via ORAL
  Filled 2015-09-29 (×9): qty 1

## 2015-09-29 MED ORDER — POTASSIUM CHLORIDE CRYS ER 20 MEQ PO TBCR
40.0000 meq | EXTENDED_RELEASE_TABLET | Freq: Once | ORAL | Status: AC
  Administered 2015-09-29: 40 meq via ORAL
  Filled 2015-09-29: qty 2

## 2015-09-29 MED ORDER — IOHEXOL 350 MG/ML SOLN
100.0000 mL | Freq: Once | INTRAVENOUS | Status: AC | PRN
  Administered 2015-09-29: 100 mL via INTRAVENOUS

## 2015-09-29 MED ORDER — HYDROMORPHONE HCL 1 MG/ML IJ SOLN
1.0000 mg | INTRAMUSCULAR | Status: DC | PRN
Start: 2015-09-29 — End: 2015-10-03
  Administered 2015-09-29 – 2015-10-02 (×9): 1 mg via INTRAVENOUS
  Filled 2015-09-29 (×9): qty 1

## 2015-09-29 MED ORDER — ASPIRIN EC 325 MG PO TBEC
325.0000 mg | DELAYED_RELEASE_TABLET | Freq: Once | ORAL | Status: AC
  Administered 2015-09-29: 325 mg via ORAL
  Filled 2015-09-29: qty 1

## 2015-09-29 MED ORDER — HEPARIN BOLUS VIA INFUSION
4000.0000 [IU] | Freq: Once | INTRAVENOUS | Status: AC
  Administered 2015-09-29: 4000 [IU] via INTRAVENOUS
  Filled 2015-09-29: qty 4000

## 2015-09-29 NOTE — ED Notes (Signed)
Pt c/o generalized chest pain onset Saturday. Pain increases with activity and causes shortness of breath, nausea, and diaphoresis.

## 2015-09-29 NOTE — Telephone Encounter (Signed)
Patient checked into ED 

## 2015-09-29 NOTE — Telephone Encounter (Signed)
Patient Name: Sarah Leach  DOB: 24-Dec-1980    Initial Comment Caller states having chest pain, dizzy, don't feel like eating   Nurse Assessment  Nurse: Leilani Merl, RN, Heather Date/Time (Eastern Time): 09/29/2015 8:44:15 AM  Confirm and document reason for call. If symptomatic, describe symptoms. ---Caller states having chest pain, dizzy, don't feel like eating, this started 2 days ago  Has the patient traveled out of the country within the last 30 days? ---Not Applicable  Does the patient have any new or worsening symptoms? ---Yes  Will a triage be completed? ---Yes  Related visit to physician within the last 2 weeks? ---No  Does the PT have any chronic conditions? (i.e. diabetes, asthma, etc.) ---No  Did the patient indicate they were pregnant? ---No  Is this a behavioral health or substance abuse call? ---No     Guidelines    Guideline Title Affirmed Question Affirmed Notes  Chest Pain SEVERE chest pain    Final Disposition User   Go to ED Now [place], RN, [facility] - ED   Disagree/Comply: Comply

## 2015-09-29 NOTE — H&P (Signed)
Name: Sarah Leach MRN: [id number] DOB: 02-07-1981    ADMISSION DATE:  09/29/2015 CONSULTATION DATE:  09/29/2015  REFERRING MD :  Dr. Olevia Bowens [facility]  CHIEF COMPLAINT:  Chest pain/SOB  BRIEF PATIENT DESCRIPTION: 34 year old female presented to the emergency department 12/12 complaining of her substernal chest pain and shortness of breath. CT angiogram in the emergency department demonstrated pulmonary embolism with moderate to large clot burden and CT diagnosed RV strain with an RV LV ratio of 1. Patient was started on heparin PCCM was consulted.  SIGNIFICANT EVENTS  12/12 admit  STUDIES:  CT angio chest 12/12> Positive for acute PE with CT evidence of right heart strain (RV/LV Ratio = 1.0) Positive for bilateral pulmonary emboli. Large clot burden in the proximal left lower lobar pulmonary artery. There is also clot extending into the left upper lobe segmental branches. Clot extending into some branches of the right upper lobe. Thrombus extending into the right middle lobe and right lower lobe branches. Overall, the clot burden is moderate to large.  HISTORY OF PRESENT ILLNESS:  34 year old female with past medical history as below, which includes hypercholesterolemia, headaches, arthritis. She is surgical history which includes tubal ligation. She was in her usual state of health until a few days ago when she noticed some numbness in her left lower extremity adenopathy much of it. She did not notice any edema, redness, or pain to the lower extremity. Saturday 12/10 when she first described substernal chest pain and shortness of breath. This became progressively worse over the following days and so she presented to [facility] emergency department 12/12 complaining of severe substernal chest pain 9 out of 10. Pain was described as someone sitting on her chest. She also complained of shortness of breath. She was hemodynamically stable in the emergency department with oxygen saturations in the mid 90s  on room air. EKG showed T-wave inversions in leads II, III, avF, V4-6 which are new compared to the previous EKG in 02/2014. Troponin was also noted to be elevated. She was taken for CT angiogram with consideration for PE which demonstrated at least submassive PE with moderate to large clot burden as outlined above. She was started on heparin infusion and admitted to the hospitalist team. PCCM to see in consultation.  Of note the patient denies any recent travel, smoking, oral contraceptives, history of blood clots, family history of blood clots, recent surgeries or hospitalizations, cancers, and reports generally a pretty active lifestyle.  PAST MEDICAL HISTORY :   has a past medical history of Pure hypercholesterolemia (08/16/2014); Osteoarthritis of knees (01/16/2015); Bilateral headaches (01/16/2015); and Anemia.  has past surgical history that includes Tubal ligation (2010). Prior to Admission medications   Not on File   No Known Allergies  FAMILY HISTORY:  family history includes Arthritis in her maternal grandfather and maternal grandmother; Hearing loss in her maternal grandfather and maternal grandmother. SOCIAL HISTORY:  reports that she has never smoked. She has never used smokeless tobacco. She reports that she does not drink alcohol or use illicit drugs.  REVIEW OF SYSTEMS:   Bolds are positive  Constitutional: weight loss, gain, night sweats, Fevers, chills, fatigue .  HEENT: headaches, Sore throat, sneezing, nasal congestion, post nasal drip, Difficulty swallowing, Tooth/dental problems, visual complaints visual changes, ear ache CV:  chest pain, radiates: ,Orthopnea, PND, swelling in lower extremities, dizziness, palpitations, syncope.  GI  heartburn, indigestion, abdominal pain, nausea, vomiting, diarrhea, change in bowel habits, loss of appetite, bloody stools.  Resp: cough, productive: ,  hemoptysis, dyspnea, chest pain, pleuritic.  Skin: rash or itching or icterus GU:  dysuria, change in color of urine, urgency or frequency. flank pain, hematuria  MS: joint pain or swelling. decreased range of motion  Psych: change in mood or affect. depression or anxiety.  Neuro: difficulty with speech, weakness, numbness, ataxia   SUBJECTIVE:   VITAL SIGNS: Temp:  [97.9 F (36.6 C)] 97.9 F (36.6 C) (12/12 1103) Pulse Rate:  [81-105] 88 (12/12 1900) Resp:  [12-31] 20 (12/12 1900) BP: (107-142)/(59-94) 120/89 mmHg (12/12 1900) SpO2:  [93 %-98 %] 98 % (12/12 1900) Weight:  [74.844 kg (165 lb)] 74.844 kg (165 lb) (12/12 1103)  PHYSICAL EXAMINATION: General:  Female of normal body habitus in no acute distress Neuro:  Alert, oriented, nonfocal HEENT:  [place]/AT, no appreciable JVD, PERRL Cardiovascular:  Regular rate and rhythm, no MRG Lungs:  Clear bilateral breath sounds, deep inspiration limited due to chest pain Abdomen:  Soft, nontender, nondistended Musculoskeletal:  No acute deformity or range of motion limitations Skin:  Grossly intact   Recent Labs Lab 09/29/15 1118  NA 142  K 2.9*  CL 110  CO2 23  BUN 10  CREATININE 0.93  GLUCOSE 107*    Recent Labs Lab 09/29/15 1118  HGB 13.2  HCT 39.7  WBC 8.3  PLT 303   Dg Chest 2 View  09/29/2015  CLINICAL DATA:  Generalized chest pain, onset Saturday. EXAM: CHEST  2 VIEW COMPARISON:  None. FINDINGS: The heart size and mediastinal contours are within normal limits. Both lungs are clear. The visualized skeletal structures are unremarkable. IMPRESSION: No active cardiopulmonary disease. Electronically Signed   By: Rolm Baptise M.D.   On: 09/29/2015 11:37   Ct Angio Chest Pe W/cm &/or Wo Cm  09/29/2015  CLINICAL DATA:  34 year old with generalized chest pain. Pain increases with activity and causes shortness of breath. EXAM: CT ANGIOGRAPHY CHEST WITH CONTRAST TECHNIQUE: Multidetector CT imaging of the chest was performed using the standard protocol during bolus administration of intravenous contrast.  Multiplanar CT image reconstructions and MIPs were obtained to evaluate the vascular anatomy. CONTRAST:  161mL OMNIPAQUE IOHEXOL 350 MG/ML SOLN COMPARISON:  Chest radiograph 09/29/2015 FINDINGS: Mediastinum/Lymph Nodes: Positive for bilateral pulmonary emboli. Large clot burden in the proximal left lower lobar pulmonary artery. There is also clot extending into the left upper lobe segmental branches. Clot extending into some branches of the right upper lobe. Thrombus extending into the right middle lobe and right lower lobe branches. Overall, the clot burden is moderate to large. RV/LV ratio is approximately 1.0. No significant deviation of the intrerventricular septum. Mid ascending thoracic aorta measures 3.3 cm. Main pulmonary artery measuring 3.4 cm. Great vessels are patent. No significant chest lymphadenopathy. Small amount of pericardial fluid. Lungs/Pleura: No pleural effusions. Trachea and mainstem bronchi are patent. Few scattered ground-glass densities in the lungs but no significant consolidation or airspace disease. Most prominent ground-glass densities are in the left upper lobe. Upper abdomen: Images of the upper abdomen are unremarkable. Musculoskeletal: No acute bone abnormality. Review of the MIP images confirms the above findings. IMPRESSION: Positive for acute PE with CT evidence of right heart strain (RV/LV Ratio = 1.0) consistent with at least submassive (intermediate risk) PE. The presence of right heart strain has been associated with an increased risk of morbidity and mortality. Please activate Code PE by paging [phone]. Critical Value/emergent results were called by telephone at the time of interpretation on 09/29/2015 at 6:07 pm to Dr. Rex Kras , who  verbally acknowledged these results. Electronically Signed   By: Markus Daft M.D.   On: 09/29/2015 18:17    ASSESSMENT / PLAN: Discussion: 34 year old female no significant past medical history is demonstrated to have bilateral pulmonary  emboli with moderate to large clot burden. PE seems to be unprovoked, as she denies just about every risk factor. She was hemodynamically stable in the emergency department with oxygen saturations in the mid 90s on room air. RV LV ratio on CT is mildly elevated at 1. It is reasonable to maintain her with a heparin drip at this time, however, elevated troponins and EKG changes are concerning. Will need echocardiogram to determine true extent of RV strain. Should her oxygen saturations began to deteriorate, or should she become hypotensive, would have a low threshold to administer thrombolytics, with catheter directed being preferred should the situation allow.  Pulmonary emboli with moderate to large clot burden. Mild RV strain demonstrated on CT. -Heparin gtt -Transition to oral agent (warfarin vs NOAC) 12/13 unless there is an indication for EKOS -BLE dopplers to assess for DVT -Echocardiogram pending to assess RV fxn. Consider EKOS if evidence for RV strain -No role for thrombolytic therapy at this time.  -Would pursue genetic component of hypercoagulable panel as this is seemingly unprovoked and age less than 7 (factor 5, prothrombin gene mutation). Will need to check the remaining hypercoag panel once the patient has been treated for her clot and is off therapy (Protein C, Protein S, Beta 2 glycoprotein, homocysteine, AT III, etc) -Repeat EKG in AM -Trend troponin -Assess lactic  UTI on UA, asymptomatic Management per primary  Hypokalemia Management per primary   Georgann Housekeeper, AGACNP-BC [facility] Pulmonology/Critical Care Pager [phone] or [phone]  09/29/2015 7:53 PM   Attending Note:  I have examined patient, reviewed labs, studies and notes. I have discussed the case with Jaclynn Guarneri, and I agree with the data and plans as amended above. Await TTE results to assess for R heart strain. Suspect that she will not meet criteria for EKOS. Will follow with you   Baltazar Apo,  MD, PhD 09/30/2015, 1:22 AM  Pulmonary and Critical Care [phone] or if no answer [phone]

## 2015-09-29 NOTE — H&P (Signed)
[facility] History and Physical  Sarah Leach [id number] DOB: August 27, 1981 DOA: 09/29/2015  Referring physician: Theotis Burrow, MD PCP: [name]., DO   Chief Complaint: Chest Pain and Shortness of breath.  HPI: Sarah Leach is a 34 y.o. female with a past medical history of hyperlipidemia, perimenstrual headaches, ulcer arthritis of the knees who comes to the emergency department due to sudden onset, pleuritic, pressure-like chest pain associated with dyspnea, dizziness, palpitations and nausea since Saturday morning. The pain does not change much with exertion, but the patient dyspnea increases with activity. She states that she also had a an episode of loss of consciousness on Saturday while she was talking to her husband. She does not remember for how long she was unconscious, but states that her husband caught her before she injured herself. She reports a history of a similar loss of consciousness several months ago. She denies using oral contraceptive pills, cigarette smoking, long travels by ground or air, sitting for long periods, personal history of miscarriages or family history of hypercoagulable states.   When seen in the emergency department, patient was in no acute distress, but stated she was still having some chest pain. Workup reveals a pulmonary embolus and with right-sided heart straining.   Review of Systems:  Constitutional:  Positive fatigue.  No weight loss, night sweats, Fevers, chills, HEENT:  No headaches, Difficulty swallowing,Tooth/dental problems,Sore throat,  No sneezing, itching, ear ache, nasal congestion, post nasal drip,  Cardio-vascular:  Positive chest pain, dizziness, palpitations. Orthopnea, PND, swelling in lower extremities, anasarca,  GI:  Positive for nausea, loss of appetite  No heartburn, indigestion, abdominal pain, vomiting, diarrhea, change in bowel habits.  Resp:  Positive dyspnea, denies productive cough, wheezing or  hemoptysis.  Skin:  no rash or lesions.  GU:  no dysuria, change in color of urine, no urgency or frequency. No flank pain.  Musculoskeletal:  Occasional arthralgias of the knees. No decreased range of motion. No back pain.  Psych:  No change in mood or affect. No depression or anxiety. No memory loss.   Past Medical History  Diagnosis Date  . Pure hypercholesterolemia 08/16/2014  . Osteoarthritis of knees 01/16/2015  . Bilateral headaches 01/16/2015    -chronic -associated with her menstrual periods   . Anemia     resolved, mild for some time - does give plasma twice per week which is likely etiology   Past Surgical History  Procedure Laterality Date  . Tubal ligation  2010   Social History:  reports that she has never smoked. She has never used smokeless tobacco. She reports that she does not drink alcohol or use illicit drugs.  No Known Allergies  Family History  Problem Relation Age of Onset  . Arthritis Maternal Grandmother   . Hearing loss Maternal Grandmother   . Arthritis Maternal Grandfather   . Hearing loss Maternal Grandfather     Prior to Admission medications   Not on File   Physical Exam: Filed Vitals:   09/29/15 1900 09/29/15 1930 09/29/15 2000 09/29/15 2030  BP: 120/89 134/85 125/80 109/76  Pulse: 88 98 92   Temp:      TempSrc:      Resp: 20 30 25 21   Height:      Weight:      SpO2: 98% 93% 100%     Wt Readings from Last 3 Encounters:  09/29/15 74.844 kg (165 lb)  07/28/15 74.662 kg (164 lb 9.6 oz)  03/04/15 77.157 kg (170 lb  1.6 oz)    General:  Appears calm and comfortable Eyes: PERRL, normal lids, irises & conjunctiva ENT: grossly normal hearing, lips & tongue Neck: no LAD, masses or thyromegaly Cardiovascular: RRR, no m/r/g. No LE edema. Telemetry: SR, no arrhythmias  Respiratory: CTA bilaterally, no w/r/r. Normal respiratory effort. Abdomen: soft, ntnd Skin: no rash or induration seen on limited exam Musculoskeletal: grossly normal  tone BUE/BLE Psychiatric: grossly normal mood and affect, speech fluent and appropriate Neurologic: grossly non-focal.          Labs on Admission:  Basic Metabolic Panel:  Recent Labs Lab 09/29/15 1118 09/29/15 1932  NA 142  --   K 2.9*  --   CL 110  --   CO2 23  --   GLUCOSE 107*  --   BUN 10  --   CREATININE 0.93  --   CALCIUM 8.8*  --   MG  --  2.0  PHOS  --  3.1   CBC:  Recent Labs Lab 09/29/15 1118  WBC 8.3  HGB 13.2  HCT 39.7  MCV 86.3  PLT 303   Cardiac Enzymes:  Recent Labs Lab 09/29/15 1932  TROPONINI 0.14*     Radiological Exams on Admission: Dg Chest 2 View  09/29/2015  CLINICAL DATA:  Generalized chest pain, onset Saturday. EXAM: CHEST  2 VIEW COMPARISON:  None. FINDINGS: The heart size and mediastinal contours are within normal limits. Both lungs are clear. The visualized skeletal structures are unremarkable. IMPRESSION: No active cardiopulmonary disease. Electronically Signed   By: Rolm Baptise M.D.   On: 09/29/2015 11:37   Ct Angio Chest Pe W/cm &/or Wo Cm  09/29/2015  CLINICAL DATA:  34 year old with generalized chest pain. Pain increases with activity and causes shortness of breath. EXAM: CT ANGIOGRAPHY CHEST WITH CONTRAST TECHNIQUE: Multidetector CT imaging of the chest was performed using the standard protocol during bolus administration of intravenous contrast. Multiplanar CT image reconstructions and MIPs were obtained to evaluate the vascular anatomy. CONTRAST:  182mL OMNIPAQUE IOHEXOL 350 MG/ML SOLN COMPARISON:  Chest radiograph 09/29/2015 FINDINGS: Mediastinum/Lymph Nodes: Positive for bilateral pulmonary emboli. Large clot burden in the proximal left lower lobar pulmonary artery. There is also clot extending into the left upper lobe segmental branches. Clot extending into some branches of the right upper lobe. Thrombus extending into the right middle lobe and right lower lobe branches. Overall, the clot burden is moderate to large. RV/LV  ratio is approximately 1.0. No significant deviation of the intrerventricular septum. Mid ascending thoracic aorta measures 3.3 cm. Main pulmonary artery measuring 3.4 cm. Great vessels are patent. No significant chest lymphadenopathy. Small amount of pericardial fluid. Lungs/Pleura: No pleural effusions. Trachea and mainstem bronchi are patent. Few scattered ground-glass densities in the lungs but no significant consolidation or airspace disease. Most prominent ground-glass densities are in the left upper lobe. Upper abdomen: Images of the upper abdomen are unremarkable. Musculoskeletal: No acute bone abnormality. Review of the MIP images confirms the above findings. IMPRESSION: Positive for acute PE with CT evidence of right heart strain (RV/LV Ratio = 1.0) consistent with at least submassive (intermediate risk) PE. The presence of right heart strain has been associated with an increased risk of morbidity and mortality. Please activate Code PE by paging [phone]. Critical Value/emergent results were called by telephone at the time of interpretation on 09/29/2015 at 6:07 pm to Dr. Rex Kras , who verbally acknowledged these results. Electronically Signed   By: Markus Daft M.D.   On: 09/29/2015 18:17  EKG: Independently reviewed. Vent. rate 97 BPM PR interval 164 ms QRS duration 94 ms QT/QTc 414/525 ms P-R-T axes 59 88 -49 Normal sinus rhythm ST & T wave abnormality, consider inferior ischemia ST & T wave abnormality, consider anterolateral ischemia Prolonged QT Abnormal ECG  Assessment/Plan Principal Problem:   Pulmonary embolism (HCC) Admit to a stepdown. Continue unfractionated heparin infusion per pharmacy. GI prophylaxis with pantoprazole 40 mg by mouth daily. Trend troponin levels. Check echocardiogram in the morning. Follow-up hypercoagulable state workup.  Active Problems:   Hyperlipidemia Per patient, this is only treated with lifestyle modifications.    Hypokalemia Continue  potassium replacement and monitor level. Check magnesium level.    Elevated troponin Continue troponin levels trending. EKG in the morning.   Critical care saw the patient in the emergency department. Please see recommendations.  Code Status: Full code. DVT Prophylaxis: On full dose heparin. Family Communication: Disposition Plan: Admit to a stepdown to continue anticoagulation therapy with heparin  Time spent: Over 70 minutes were spent during the process of this admission.  Reubin Milan [facility] Pager [phone].

## 2015-09-29 NOTE — Progress Notes (Signed)
ANTICOAGULATION CONSULT NOTE - Initial Consult  Pharmacy Consult for Heparin Indication: pulmonary embolus  No Known Allergies  Patient Measurements: Height: 5\' 4"  (162.6 cm) Weight: 165 lb (74.844 kg) IBW/kg (Calculated) : 54.7 Heparin Dosing Weight: 70 kg  Vital Signs: Temp: 97.9 F (36.6 C) (12/12 1103) Temp Source: Oral (12/12 1103) BP: 115/59 mmHg (12/12 1700) Pulse Rate: 87 (12/12 1700)  Labs:  Recent Labs  09/29/15 1118  HGB 13.2  HCT 39.7  PLT 303  CREATININE 0.93    Estimated Creatinine Clearance: 84.4 mL/min (by C-G formula based on Cr of 0.93).   Medical History: Past Medical History  Diagnosis Date  . Pure hypercholesterolemia 08/16/2014  . Osteoarthritis of knees 01/16/2015  . Bilateral headaches 01/16/2015    -chronic -associated with her menstrual periods   . Anemia     resolved, mild for some time - does give plasma twice per week which is likely etiology    Medications:   (Not in a hospital admission) Scheduled:  Infusions:   Assessment: 34yo female with history of HLD and anemia presents with SOB and CP. Pharmacy is consulted to dose heparin for suspected PE. CBC wnl,, sCr 0.93, Trop 0.15 > 0.14.  Goal of Therapy:  Heparin level 0.3-0.7 units/ml Monitor platelets by anticoagulation protocol: Yes   Plan:  Give 4000 units bolus x 1 Start heparin infusion at 1200 units/hr Check anti-Xa level in 6 hours and daily while on heparin Continue to monitor H&H and platelets  Andrey Cota. Diona Foley, PharmD, [place] Clinical Pharmacist Pager [phone] 09/29/2015,6:11 PM

## 2015-09-29 NOTE — ED Notes (Signed)
Pt in CT.

## 2015-09-29 NOTE — ED Provider Notes (Signed)
CSN: [id number]     Arrival date & time 09/29/15  1056 History   First MD Initiated Contact with Patient 09/29/15 1335     Chief Complaint  Patient presents with  . Chest Pain     (Consider location/radiation/quality/duration/timing/severity/associated sxs/prior Treatment) HPI Comments: Patient is a 34 year old female with a past medical history of hypercholesterolemia, bilateral headaches, anemia, and osteoarthritis of knees presenting to the ED with a chief complaint of chest pain. Patient is complaining of constant substernal and left-sided chest pain, 9/10 intensity that started on Saturday and is constant. She describes the pain as "someone sitting on me." States the pain is present both at rest and with activity and is associated with shortness of breath, palpitations, decreased appetite, dizziness, and nausea. Denies any history of chest pain or palpitations. Denies any recent travel or OCP use. Denies having any injury to the chest area; changing positions does not make her chest pain better or worse. States she lost consciousness on Saturday - was talking to her husband and all of a sudden "passed out." She does not remember the event or for how long she had lost consciousness. Denies any injury and states her husband caught her before she could hit the ground. Reports having a similar episode of loss of consciousness a few months ago. Denies any histories of seizures or stroke. Denies any tobacco, ethanol, or drug use.   Patient is a 34 y.o. female presenting with chest pain.  Chest Pain Associated symptoms: dizziness, nausea, numbness and palpitations   Associated symptoms: no abdominal pain, no cough, no fever, no shortness of breath, not vomiting and no weakness     Past Medical History  Diagnosis Date  . Pure hypercholesterolemia 08/16/2014  . Osteoarthritis of knees 01/16/2015  . Bilateral headaches 01/16/2015    -chronic -associated with her menstrual periods   . Anemia      resolved, mild for some time - does give plasma twice per week which is likely etiology   Past Surgical History  Procedure Laterality Date  . Tubal ligation  2010   Family History  Problem Relation Age of Onset  . Arthritis Maternal Grandmother   . Hearing loss Maternal Grandmother   . Arthritis Maternal Grandfather   . Hearing loss Maternal Grandfather    Social History  Substance Use Topics  . Smoking status: Never Smoker   . Smokeless tobacco: Never Used  . Alcohol Use: No   OB History    No data available     Review of Systems  Constitutional: Negative for fever and chills.  HENT: Negative for congestion and rhinorrhea.   Eyes: Negative for pain and discharge.  Respiratory: Negative for cough, shortness of breath and wheezing.   Cardiovascular: Positive for chest pain and palpitations. Negative for leg swelling.  Gastrointestinal: Positive for nausea. Negative for vomiting, abdominal pain, diarrhea and constipation.  Genitourinary: Negative for dysuria, urgency, frequency, flank pain, decreased urine volume and difficulty urinating.  Musculoskeletal: Negative for myalgias and arthralgias.  Skin: Negative for rash.  Neurological: Positive for dizziness, syncope, light-headedness and numbness. Negative for seizures and weakness.      Allergies  Review of patient's allergies indicates no known allergies.  Home Medications   Prior to Admission medications   Not on File   BP 129/85 mmHg  Pulse 88  Temp(Src) 97.9 F (36.6 C) (Oral)  Resp 15  Ht 5\' 4"  (1.626 m)  Wt 74.844 kg  BMI 28.31 kg/m2  SpO2 94%  LMP  09/22/2015 Physical Exam  Constitutional: She is oriented to person, place, and time. She appears well-developed and well-nourished. No distress.  HENT:  Head: Normocephalic and atraumatic.  Mouth/Throat: Oropharynx is clear and moist.  Eyes: EOM are normal. Pupils are equal, round, and reactive to light.  Neck: Neck supple. No tracheal deviation present.   Cardiovascular: Normal rate, regular rhythm and intact distal pulses.  Exam reveals no gallop and no friction rub.   No murmur heard. Pulmonary/Chest: Effort normal and breath sounds normal. No respiratory distress. She has no wheezes. She has no rales.  Abdominal: Soft. Bowel sounds are normal. She exhibits no distension. There is no tenderness. There is no rebound and no guarding.  Genitourinary:  No CVA tenderness bilaterally   Musculoskeletal: She exhibits no edema.  Neurological: She is alert and oriented to person, place, and time. No cranial nerve deficit.  Strength 5 out of 5 in bilateral upper extremities. Strength 4 out of 5 in left lower extremity and 5 out of 5 in right lower extremity. Sensation grossly intact in bilateral upper extremities. Sensation decreased in the left lower extremity compared to the right.    Skin: Skin is warm and dry.    ED Course  Procedures (including critical care time) Labs Review Labs Reviewed  BASIC METABOLIC PANEL - Abnormal; Notable for the following:    Potassium 2.9 (*)    Glucose, Bld 107 (*)    Calcium 8.8 (*)    All other components within normal limits  I-STAT TROPOININ, ED - Abnormal; Notable for the following:    Troponin i, poc 0.15 (*)    All other components within normal limits  CBC  I-STAT TROPOININ, ED    Imaging Review Dg Chest 2 View  09/29/2015  CLINICAL DATA:  Generalized chest pain, onset Saturday. EXAM: CHEST  2 VIEW COMPARISON:  None. FINDINGS: The heart size and mediastinal contours are within normal limits. Both lungs are clear. The visualized skeletal structures are unremarkable. IMPRESSION: No active cardiopulmonary disease. Electronically Signed   By: Rolm Baptise M.D.   On: 09/29/2015 11:37   I have personally reviewed and evaluated these images and lab results as part of my medical decision-making.   EKG Interpretation None      MDM   Final diagnoses:  None  Chest pain Patient is presenting  with constant, 9/10 intensity, pressure-like, substernal and left-sided chest pain for the past 2 days. The chest pain is present both at rest and with activity and is associated with shortness of breath, palpitations, decreased appetite, dizziness, and nausea. Patient also reports loosing consciousness 2 days ago after the chest pain started. Reports having a similar episode of loss of consciousness a few months ago but denies any history of CP or palpitations. Vitals showing HR 101 and O2 saturation 95% on RA. EKG showing T wave inversions in leads II, III, avF, V4-6 which are new compared to the previous EKG in 02/2014. Chest x-ray showing no acute abnormality. Istat Troponin 0.15; repeat delta Troponin 0.14 which is reassuring. UDS negative. Musculoskeletal pain is less likely because patient denies having any injury to the chest area; positional changes do not make the chest pain better or worse. Denies any recent travel/ OCP use and wells score is 1 - low risk for PE. However, patient is tachycardic, tachypnic, and complianing of chest pain and SOB.  -pending CTA chest to r/o PE  Hypokalemia: Labs showing K 2.9 which was repleted orally (KDur 40 meq).  UA is positive for nitrites. Also showing calcium oxylate crystals and trace Hgb possibly due to nephrolithiasis. I spoke to the patient and she denies having any dysuria, frequency, urgency, suprapubic pain, or flank pain. No CVA tenderness bilaterally. Will not treat her with antibiotics at this time since she is asymptomatic. However, I did explain to the patient that if she develops any of these symptoms in the future, she should call her PCP or seek medical attention immediately. She agreed with the plan.   My shift has ended and I have signed off the patient to my attending Dr. Eulis Foster at 4:50 pm.      Shela Leff, MD 09/29/15 [place], MD 09/30/15 1714

## 2015-09-29 NOTE — ED Provider Notes (Signed)
  Face-to-face evaluation   History: Patient with ongoing shortness of breath for several days accompanied by chest discomfort which occurs when she ambulates, or talks a lot. At rest she has mild chest pain, and shortness of breath. She denies leg pain or swelling. She's never had this previously.  Physical exam: Alert, cooperative. Heart regular rate and rhythm, no murmur. Lungs clear  Medical screening examination/treatment/procedure(s) were conducted as a shared visit with resident and myself.  I personally evaluated the patient during the encounter  Daleen Bo, MD 09/30/15 [phone]

## 2015-09-30 ENCOUNTER — Encounter (HOSPITAL_COMMUNITY): Payer: Self-pay | Admitting: Radiology

## 2015-09-30 ENCOUNTER — Inpatient Hospital Stay (HOSPITAL_COMMUNITY): Payer: Self-pay

## 2015-09-30 DIAGNOSIS — I2699 Other pulmonary embolism without acute cor pulmonale: Principal | ICD-10-CM

## 2015-09-30 DIAGNOSIS — R7989 Other specified abnormal findings of blood chemistry: Secondary | ICD-10-CM

## 2015-09-30 DIAGNOSIS — E785 Hyperlipidemia, unspecified: Secondary | ICD-10-CM

## 2015-09-30 LAB — CBC WITH DIFFERENTIAL/PLATELET
BASOS ABS: 0 10*3/uL (ref 0.0–0.1)
Basophils Relative: 1 %
EOS ABS: 0.5 10*3/uL (ref 0.0–0.7)
Eosinophils Relative: 6 %
HCT: 38.3 % (ref 36.0–46.0)
HEMOGLOBIN: 12.6 g/dL (ref 12.0–15.0)
LYMPHS ABS: 2.6 10*3/uL (ref 0.7–4.0)
Lymphocytes Relative: 31 %
MCH: 28.6 pg (ref 26.0–34.0)
MCHC: 32.9 g/dL (ref 30.0–36.0)
MCV: 87 fL (ref 78.0–100.0)
Monocytes Absolute: 0.5 10*3/uL (ref 0.1–1.0)
Monocytes Relative: 6 %
Neutro Abs: 4.7 10*3/uL (ref 1.7–7.7)
Neutrophils Relative %: 56 %
Platelets: 309 10*3/uL (ref 150–400)
RBC: 4.4 MIL/uL (ref 3.87–5.11)
RDW: 13.3 % (ref 11.5–15.5)
WBC: 8.3 10*3/uL (ref 4.0–10.5)

## 2015-09-30 LAB — COMPREHENSIVE METABOLIC PANEL
ALT: 11 U/L — AB (ref 14–54)
AST: 20 U/L (ref 15–41)
Albumin: 3.2 g/dL — ABNORMAL LOW (ref 3.5–5.0)
Alkaline Phosphatase: 58 U/L (ref 38–126)
Anion gap: 11 (ref 5–15)
BUN: 8 mg/dL (ref 6–20)
CHLORIDE: 110 mmol/L (ref 101–111)
CO2: 21 mmol/L — AB (ref 22–32)
CREATININE: 0.93 mg/dL (ref 0.44–1.00)
Calcium: 8.3 mg/dL — ABNORMAL LOW (ref 8.9–10.3)
GFR calc Af Amer: >60 mL/min (ref >60)
GFR calc non Af Amer: >60 mL/min (ref >60)
Glucose, Bld: 102 mg/dL — ABNORMAL HIGH (ref 65–99)
Potassium: 3.7 mmol/L (ref 3.5–5.1)
SODIUM: 142 mmol/L (ref 135–145)
Total Bilirubin: 0.8 mg/dL (ref 0.3–1.2)
Total Protein: 5.9 g/dL — ABNORMAL LOW (ref 6.5–8.1)

## 2015-09-30 LAB — HEPARIN LEVEL (UNFRACTIONATED)
Heparin Unfractionated: 0.54 IU/mL (ref 0.30–0.70)
Heparin Unfractionated: 0.55 IU/mL (ref 0.30–0.70)

## 2015-09-30 LAB — TROPONIN I
TROPONIN I: 0.07 ng/mL — AB (ref <0.031)
TROPONIN I: 0.11 ng/mL — AB (ref <0.031)

## 2015-09-30 MED ORDER — POTASSIUM CHLORIDE IN NACL 20-0.9 MEQ/L-% IV SOLN
INTRAVENOUS | Status: DC
  Administered 2015-09-30: 100 mL/h via INTRAVENOUS
  Filled 2015-09-30: qty 1000

## 2015-09-30 MED ORDER — LORAZEPAM 0.5 MG PO TABS
0.5000 mg | ORAL_TABLET | Freq: Once | ORAL | Status: AC
  Administered 2015-09-30: 0.5 mg via ORAL
  Filled 2015-09-30: qty 1

## 2015-09-30 NOTE — Clinical Social Work Note (Signed)
Clinical Social Work Assessment  Patient Details  Name: Sarah Leach MRN: [id number] Date of Birth: 1981/10/18  Date of referral:  09/30/15               Reason for consult:  Housing Concerns/Homelessness, Discharge Planning                Permission sought to share information with:    Permission granted to share information::     Name::        Agency::     Relationship::     Contact Information:     Housing/Transportation Living arrangements for the past 2 months:  Single Family Home Source of Information:  Patient, Spouse Patient Interpreter Needed:  None Criminal Activity/Legal Involvement Pertinent to Current Situation/Hospitalization:  No - Comment as needed Significant Relationships:  Spouse, Parents Lives with:  Spouse, Roommate Do you feel safe going back to the place where you live?  Yes Need for family participation in patient care:  No (Coment)  Care giving concerns:  No care giver concerns at this time   Social Worker assessment / plan:  CSW met with patient and her husband at Patient's bedside. Patient reports that she currently lives with her husband and a housemate (whom is the owner of the home). Patient reports that her housemate has sold the home and they will have to be out of the home by December 22nd, 2016. Patient and her husband report that they are both unemployed and have no income. Patient reports that she also has no insurance. Patient reports that she has been in communication with [employer] and has exhausted that resource. She reports that she has also been in communication with the [employer] but reports no success at this time. Patient reports that she does not have any supports aside from her husband. She reports that her two biological children are under the custody of her parents whom live 2+ hours away and she does not want to continue to bother them. She reports that her husband's family is not supportive. CSW provided patient with  resources for the [facility], [employer], and [place]. Patient notes that she would like to touch base with a Education officer, museum closer to her discharge after she has had an opportunity to talk with the resources provided. CSW will continue to follow up.   Employment status:  Unemployed Forensic scientist:  Self Pay (Medicaid Pending) PT Recommendations:  Not assessed at this time Information / Referral to community resources:  Shelter  Patient/Family's Response to care:  Patient husband at bedside. Patient's husband expressed the same concerns regarding housing. Patient was appreciative of CSW's visit. Patient and husband agreed to utilize resources provided.  Patient/Family's Understanding of and Emotional Response to Diagnosis, Current Treatment, and Prognosis:  Not discussed  Emotional Assessment Appearance:  Appears stated age Attitude/Demeanor/Rapport:   (cooperative and engaged) Affect (typically observed):  Calm, Stable Orientation:  Oriented to Self, Oriented to Place, Oriented to  Time, Oriented to Situation Alcohol / Substance use:  Not Applicable Psych involvement (Current and /or in the community):  No (Comment)  Discharge Needs  Concerns to be addressed:  Homelessness Readmission within the last 30 days:  No Current discharge risk:  Homeless Barriers to Discharge:  Homeless with medical needs   Judeth Horn, LCSW 09/30/2015, 3:41 PM

## 2015-09-30 NOTE — Progress Notes (Signed)
ANTICOAGULATION CONSULT NOTE - Initial Consult  Pharmacy Consult for Heparin Indication: pulmonary embolus  No Known Allergies  Patient Measurements: Height: 5\' 4"  (162.6 cm) Weight: 164 lb 0.4 oz (74.4 kg) IBW/kg (Calculated) : 54.7 Heparin Dosing Weight: 70 kg  Vital Signs: Temp: 98.2 F (36.8 C) (12/13 0731) Temp Source: Oral (12/13 0731) BP: 112/73 mmHg (12/13 0400) Pulse Rate: 77 (12/13 0400)  Labs:  Recent Labs  09/29/15 1118 09/29/15 1932 09/30/15 0037 09/30/15 0832  HGB 13.2  --  12.6  --   HCT 39.7  --  38.3  --   PLT 303  --  309  --   HEPARINUNFRC  --   --  0.54 0.55  CREATININE 0.93  --  0.93  --   TROPONINI  --  0.14* 0.11*  --     Estimated Creatinine Clearance: 84.2 mL/min (by C-G formula based on Cr of 0.93).   Medical History: Past Medical History  Diagnosis Date  . Pure hypercholesterolemia 08/16/2014  . Osteoarthritis of knees 01/16/2015  . Bilateral headaches 01/16/2015    -chronic -associated with her menstrual periods   . Anemia     resolved, mild for some time - does give plasma twice per week which is likely etiology    Medications:  No prescriptions prior to admission     Assessment: 34 yo female with history of HLD and anemia presents with SOB and CP. CTA shows acute PE with R heart strain RV/LV ratio =1. Renal fx and CBC wnl. Pt is therapeutic on heparin gtt.   Goal of Therapy:  Heparin level 0.3-0.7 units/ml Monitor platelets by anticoagulation protocol: Yes    Plan:  -Continue heparin gtt at 1200 units/hr -Daily HL, CBC -Monitor s/sx bleeding -F/u plan for PO anticoagulation    Hughes Better, PharmD, BCPS Clinical Pharmacist Pager: [phone] 09/30/2015 9:31 AM

## 2015-09-30 NOTE — Progress Notes (Signed)
Patient was in and out cathed with 450cc dark amber urine obtained, cloudy and malodorous. Patient tolerated well. [facility] [employer]

## 2015-09-30 NOTE — Progress Notes (Signed)
UR Completed Clarion Mooneyhan Graves-Bigelow, RN,BSN [phone]  

## 2015-09-30 NOTE — Progress Notes (Addendum)
*  Preliminary Results* Bilateral lower extremity venous duplex completed. The right lower extremity is positive for acute deep vein thrombosis involving the right saphenofemoral junction, femoral, profunda femoral, popliteal, posterior tibial, and peroneal veins. There is evidence of mobile thrombus in the right common femoral vein. The left lower extremity exhibits acute superficial vein thrombosis involving the greater saphenous vein at the saphenofemoral junction and the lesser saphenous vein.  Preliminary results discussed with Dr. Candiss Norse.  09/30/2015  Maudry Mayhew, RVT, RDCS, RDMS

## 2015-09-30 NOTE — Progress Notes (Addendum)
Pt crying, states her mind is racing, can't sleep. She states her and spouse have no job and having to move. Does not know where they will move. Paged Tylene Fantasia NP. [facility] [employer]

## 2015-09-30 NOTE — Progress Notes (Signed)
Chief Complaint: Patient was seen in consultation today for DVT/PE at the request of Dr. Nelda Marseille  Referring Physician(s): Dr. Nelda Marseille  History of Present Illness: Sarah Leach is a 34 y.o. female who was admitted last night with chest pain, SOB and was found to have submassive bilateral PE. Her CTA showed evidence consistent with mild right heart strain and she was put on IV heparin. She feels much better today. In the interim she has had LE venous duplex studies showing (R)LE DVT from the CFV down to tibial veins and superficial thrombophlebitis on the left. She has also had 2D Echocardiagram----awaiting report. Given the right heart strain on CTA and continued presence of LE DVT, IR is asked to consult for consideration of EKOS thrombolysis procedure and/or IVC filter. Chart, PMHx, meds, labs, imaging reviewed with Dr. Earleen Newport.  She denied having any pain or swelling in her legs prior to admission but does note some mild (R)LE discomfort now that she is here, no swelling though. Minimal risks factors for VTE, though she does report she donates plasma twice a week.   Past Medical History  Diagnosis Date  . Pure hypercholesterolemia 08/16/2014  . Osteoarthritis of knees 01/16/2015  . Bilateral headaches 01/16/2015    -chronic -associated with her menstrual periods   . Anemia     resolved, mild for some time - does give plasma twice per week which is likely etiology    Past Surgical History  Procedure Laterality Date  . Tubal ligation  2010    Allergies: Review of patient's allergies indicates no known allergies.  Medications:  Current facility-administered medications:  .  heparin ADULT infusion 100 units/mL (25000 units/250 mL), 1,200 Units/hr, Intravenous, Continuous, Rebecka Apley, [facility], Last Rate: 12 mL/hr at 09/30/15 1152, 1,200 Units/hr at 09/30/15 1152 .  HYDROmorphone (DILAUDID) injection 1 mg, 1 mg, Intravenous, Q4H PRN, Reubin Milan, MD, 1 mg at 09/30/15  0250 .  Influenza vac split quadrivalent PF (FLUARIX) injection 0.5 mL, 0.5 mL, Intramuscular, Tomorrow-1000, Reubin Milan, MD .  nitrofurantoin (MACRODANTIN) capsule 100 mg, 100 mg, Oral, Q12H, Reubin Milan, MD, 100 mg at 09/30/15 1016 .  ondansetron (ZOFRAN) tablet 4 mg, 4 mg, Oral, Q6H PRN **OR** ondansetron (ZOFRAN) injection 4 mg, 4 mg, Intravenous, Q6H PRN, Reubin Milan, MD .  pantoprazole (PROTONIX) EC tablet 40 mg, 40 mg, Oral, Daily, Reubin Milan, MD, 40 mg at 09/30/15 1016 .  sodium chloride 0.9 % injection 3 mL, 3 mL, Intravenous, Q12H, Reubin Milan, MD, 3 mL at 09/30/15 1017    Family History  Problem Relation Age of Onset  . Arthritis Maternal Grandmother   . Hearing loss Maternal Grandmother   . Arthritis Maternal Grandfather   . Hearing loss Maternal Grandfather     Social History   Social History  . Marital Status: Married    Spouse Name: N/A  . Number of Children: N/A  . Years of Education: N/A   Social History Main Topics  . Smoking status: Never Smoker   . Smokeless tobacco: Never Used  . Alcohol Use: No  . Drug Use: No  . Sexual Activity: Yes    Birth Control/ Protection: None   Other Topics Concern  . None   Social History Narrative   Work or School: works at [employer] Situation: lives with husband, 4 children - none of them live with them      Spiritual Beliefs: Darrick Meigs  Lifestyle: walks to work daily - 20 minutes; diet is ok - eats a lot [place] [employer]           Review of Systems: A 12 point ROS discussed and pertinent positives are indicated in the HPI above.  All other systems are negative.  Review of Systems  Vital Signs: BP 112/73 mmHg  Pulse 77  Temp(Src) 97.9 F (36.6 C) (Oral)  Resp 16  Ht 5\' 4"  (1.626 m)  Wt 164 lb 0.4 oz (74.4 kg)  BMI 28.14 kg/m2  SpO2 99%  LMP 09/22/2015  Physical Exam  Constitutional: She is oriented to person, place, and time. She appears well-developed and  well-nourished. No distress.  HENT:  Head: Normocephalic.  Mouth/Throat: Oropharynx is clear and moist.  Neck: Normal range of motion. No JVD present. No tracheal deviation present.  Cardiovascular: Normal rate, regular rhythm and normal heart sounds.   Pulmonary/Chest: Effort normal and breath sounds normal. No respiratory distress.  Abdominal: Soft. She exhibits no distension. There is no tenderness.  Musculoskeletal:  No appreciable LE edema, (R) calf mildly tender with palpation  Neurological: She is alert and oriented to person, place, and time.  Psychiatric: She has a normal mood and affect. Judgment normal.    Mallampati Score:  MD Evaluation Airway: WNL Heart: WNL Abdomen: WNL Chest/ Lungs: WNL ASA  Classification: 2 Mallampati/Airway Score: One  Imaging: Dg Chest 2 View  09/29/2015  CLINICAL DATA:  Generalized chest pain, onset Saturday. EXAM: CHEST  2 VIEW COMPARISON:  None. FINDINGS: The heart size and mediastinal contours are within normal limits. Both lungs are clear. The visualized skeletal structures are unremarkable. IMPRESSION: No active cardiopulmonary disease. Electronically Signed   By: Rolm Baptise M.D.   On: 09/29/2015 11:37   Ct Angio Chest Pe W/cm &/or Wo Cm  09/29/2015  CLINICAL DATA:  34 year old with generalized chest pain. Pain increases with activity and causes shortness of breath. EXAM: CT ANGIOGRAPHY CHEST WITH CONTRAST TECHNIQUE: Multidetector CT imaging of the chest was performed using the standard protocol during bolus administration of intravenous contrast. Multiplanar CT image reconstructions and MIPs were obtained to evaluate the vascular anatomy. CONTRAST:  147mL OMNIPAQUE IOHEXOL 350 MG/ML SOLN COMPARISON:  Chest radiograph 09/29/2015 FINDINGS: Mediastinum/Lymph Nodes: Positive for bilateral pulmonary emboli. Large clot burden in the proximal left lower lobar pulmonary artery. There is also clot extending into the left upper lobe segmental  branches. Clot extending into some branches of the right upper lobe. Thrombus extending into the right middle lobe and right lower lobe branches. Overall, the clot burden is moderate to large. RV/LV ratio is approximately 1.0. No significant deviation of the intrerventricular septum. Mid ascending thoracic aorta measures 3.3 cm. Main pulmonary artery measuring 3.4 cm. Great vessels are patent. No significant chest lymphadenopathy. Small amount of pericardial fluid. Lungs/Pleura: No pleural effusions. Trachea and mainstem bronchi are patent. Few scattered ground-glass densities in the lungs but no significant consolidation or airspace disease. Most prominent ground-glass densities are in the left upper lobe. Upper abdomen: Images of the upper abdomen are unremarkable. Musculoskeletal: No acute bone abnormality. Review of the MIP images confirms the above findings. IMPRESSION: Positive for acute PE with CT evidence of right heart strain (RV/LV Ratio = 1.0) consistent with at least submassive (intermediate risk) PE. The presence of right heart strain has been associated with an increased risk of morbidity and mortality. Please activate Code PE by paging [phone]. Critical Value/emergent results were called by telephone at the time of interpretation on  09/29/2015 at 6:07 pm to Dr. Rex Kras , who verbally acknowledged these results. Electronically Signed   By: Markus Daft M.D.   On: 09/29/2015 18:17    Labs:  CBC:  Recent Labs  12/13/14 0956 09/29/15 1118 09/30/15 0037  WBC 7.6 8.3 8.3  HGB 12.5 13.2 12.6  HCT 36.8 39.7 38.3  PLT 352.0 303 309    COAGS: No results for input(s): INR, APTT in the last 8760 hours.  BMP:  Recent Labs  12/13/14 0956 09/29/15 1118 09/30/15 0037  NA 141 142 142  K 4.7 2.9* 3.7  CL 108 110 110  CO2 28 23 21*  GLUCOSE 90 107* 102*  BUN 9 10 8   CALCIUM 8.6 8.8* 8.3*  CREATININE 0.85 0.93 0.93  GFRNONAA  --  >60 >60  GFRAA  --  >60 >60    LIVER FUNCTION  TESTS:  Recent Labs  09/30/15 0037  BILITOT 0.8  AST 20  ALT 11*  ALKPHOS 58  PROT 5.9*  ALBUMIN 3.2*    Assessment and Plan: Bilateral submassive PE with CT evidence of mild right heart strain. Clinically stable on IV heparin Echo completed, no report yet. If Echo does not show Right heart strain, would not see much benefit to PE lysis/EKOS procedure, given associated risks. (R)LE DVT, per tech report, mobile thrombus in CFV. Will review with Dr. Earleen Newport, may or may not need IVC filter as she is stable on IV heparin and can be anticoagulated. Keep NPO for now until decision on procedures made. Cont IV heparin Discussed each procedure with pt and husband, should we move forward.  Thank you for this interesting consult.   A copy of this report was sent to the requesting provider on this date.  SignedAscencion Dike 09/30/2015, 4:12 PM   I spent a total of 30 minutes in face to face in clinical consultation, greater than 50% of which was counseling/coordinating care for PE/DVT

## 2015-09-30 NOTE — Progress Notes (Signed)
Patient Demographics:    Sarah Leach, is a 34 y.o. female, DOB - June 09, 1981, [id number]  Admit date - 09/29/2015   Admitting Physician Reubin Milan, MD  Outpatient Primary MD for the patient is [name]., DO  LOS - 1   Chief Complaint  Patient presents with  . Chest Pain        Subjective:    Sarah Leach today has, No headache,  No abdominal pain - No Nausea, No new weakness tingling or numbness, improved chest chest pain and shortness of breath.     Assessment  & Plan :     1.Submassive PE. Unprovoked, suspicious for underlying hypercoagulable state. Has made a positive troponin, currently on heparin drip, pulmonary critical care following, discussed with pulmonary critical care physician. Await echogram and lower estimate a venous duplex. If echogram confirms right ventricular strain then she might get TPA, if echogram is stable she might be switched on oral Eliquis. Requested case management already to assist the patient in obtaining Eliquis, also requested secondary to set her up with hematology for outpatient hypercoagulable workup.   2. Elevated troponin. Due to #1 above, pattern not-consistent with ACS. No further cardiac workup except echogram as above.   3. Dyslipidemia. On diet control. Outpatient follow-up.     Code Status : Full  Family Communication  : None present  Disposition Plan  : Home in 24 hours  Consults  :  Pulmonary care  Procedures  : Pending echo gram and bilateral lower extremity venous duplex  DVT Prophylaxis  : Heparin drip  Lab Results  Component Value Date   PLT 309 09/30/2015    Inpatient Medications  Scheduled Meds: . Influenza vac split quadrivalent PF  0.5 mL Intramuscular Tomorrow-1000  . nitrofurantoin  100 mg Oral Q12H    . pantoprazole  40 mg Oral Daily  . sodium chloride  3 mL Intravenous Q12H   Continuous Infusions: . 0.9 % NaCl with KCl 20 mEq / L 100 mL/hr (09/30/15 0348)  . heparin 1,200 Units/hr (09/29/15 1831)   PRN Meds:.HYDROmorphone (DILAUDID) injection, ondansetron **OR** ondansetron (ZOFRAN) IV  Antibiotics  :    Anti-infectives    None        Objective:   Filed Vitals:   09/29/15 2126 09/29/15 2354 09/30/15 0400 09/30/15 0731  BP:  109/66 112/73   Pulse:  79 77   Temp:  98 F (36.7 C) 97.8 F (36.6 C) 98.2 F (36.8 C)  TempSrc:  Oral Oral Oral  Resp:  25 16   Height:      Weight:   74.4 kg (164 lb 0.4 oz)   SpO2: 98% 97% 99%     Wt Readings from Last 3 Encounters:  09/30/15 74.4 kg (164 lb 0.4 oz)  07/28/15 74.662 kg (164 lb 9.6 oz)  03/04/15 77.157 kg (170 lb 1.6 oz)     Intake/Output Summary (Last 24 hours) at 09/30/15 0940 Last data filed at 09/30/15 0641  Gross per 24 hour  Intake 434.34 ml  Output    450 ml  Net -15.66 ml     Physical Exam  Awake Alert, Oriented X 3, No new F.N deficits, Normal affect [place].AT,PERRAL Supple Neck,No JVD, No cervical lymphadenopathy appriciated.  Symmetrical Chest wall movement, Good air movement bilaterally, CTAB RRR,No Gallops,Rubs or new Murmurs, No Parasternal Heave +ve B.Sounds, Abd Soft, No tenderness, No organomegaly appriciated, No rebound - guarding or rigidity. No Cyanosis, Clubbing or edema, No new Rash or bruise , ? minimal right leg swelling    Data Review:   Micro Results Recent Results (from the past 240 hour(s))  MRSA PCR Screening     Status: None   Collection Time: 09/29/15  9:26 PM  Result Value Ref Range Status   MRSA by PCR NEGATIVE NEGATIVE Final    Comment:        The GeneXpert MRSA Assay (FDA approved for NASAL specimens only), is one component of a comprehensive MRSA colonization surveillance program. It is not intended to diagnose MRSA infection nor to guide or monitor treatment  for MRSA infections.     Radiology Reports Dg Chest 2 View  09/29/2015  CLINICAL DATA:  Generalized chest pain, onset Saturday. EXAM: CHEST  2 VIEW COMPARISON:  None. FINDINGS: The heart size and mediastinal contours are within normal limits. Both lungs are clear. The visualized skeletal structures are unremarkable. IMPRESSION: No active cardiopulmonary disease. Electronically Signed   By: Rolm Baptise M.D.   On: 09/29/2015 11:37   Ct Angio Chest Pe W/cm &/or Wo Cm  09/29/2015  CLINICAL DATA:  34 year old with generalized chest pain. Pain increases with activity and causes shortness of breath. EXAM: CT ANGIOGRAPHY CHEST WITH CONTRAST TECHNIQUE: Multidetector CT imaging of the chest was performed using the standard protocol during bolus administration of intravenous contrast. Multiplanar CT image reconstructions and MIPs were obtained to evaluate the vascular anatomy. CONTRAST:  149mL OMNIPAQUE IOHEXOL 350 MG/ML SOLN COMPARISON:  Chest radiograph 09/29/2015 FINDINGS: Mediastinum/Lymph Nodes: Positive for bilateral pulmonary emboli. Large clot burden in the proximal left lower lobar pulmonary artery. There is also clot extending into the left upper lobe segmental branches. Clot extending into some branches of the right upper lobe. Thrombus extending into the right middle lobe and right lower lobe branches. Overall, the clot burden is moderate to large. RV/LV ratio is approximately 1.0. No significant deviation of the intrerventricular septum. Mid ascending thoracic aorta measures 3.3 cm. Main pulmonary artery measuring 3.4 cm. Great vessels are patent. No significant chest lymphadenopathy. Small amount of pericardial fluid. Lungs/Pleura: No pleural effusions. Trachea and mainstem bronchi are patent. Few scattered ground-glass densities in the lungs but no significant consolidation or airspace disease. Most prominent ground-glass densities are in the left upper lobe. Upper abdomen: Images of the upper  abdomen are unremarkable. Musculoskeletal: No acute bone abnormality. Review of the MIP images confirms the above findings. IMPRESSION: Positive for acute PE with CT evidence of right heart strain (RV/LV Ratio = 1.0) consistent with at least submassive (intermediate risk) PE. The presence of right heart strain has been associated with an increased risk of morbidity and mortality. Please activate Code PE by paging [phone]. Critical Value/emergent results were called by telephone at the time of interpretation on 09/29/2015 at 6:07 pm to Dr. Rex Kras , who verbally acknowledged these results. Electronically Signed   By: Markus Daft M.D.   On: 09/29/2015 18:17     CBC  Recent Labs Lab 09/29/15 1118 09/30/15 0037  WBC 8.3 8.3  HGB 13.2 12.6  HCT 39.7 38.3  PLT 303 309  MCV 86.3 87.0  MCH 28.7 28.6  MCHC 33.2 32.9  RDW 13.5 13.3  LYMPHSABS  --  2.6  MONOABS  --  0.5  EOSABS  --  0.5  BASOSABS  --  0.0    Chemistries   Recent Labs Lab 09/29/15 1118 09/29/15 1932 09/30/15 0037  NA 142  --  142  K 2.9*  --  3.7  CL 110  --  110  CO2 23  --  21*  GLUCOSE 107*  --  102*  BUN 10  --  8  CREATININE 0.93  --  0.93  CALCIUM 8.8*  --  8.3*  MG  --  2.0  --   AST  --   --  20  ALT  --   --  11*  ALKPHOS  --   --  58  BILITOT  --   --  0.8   ------------------------------------------------------------------------------------------------------------------ estimated creatinine clearance is 84.2 mL/min (by C-G formula based on Cr of 0.93). ------------------------------------------------------------------------------------------------------------------ No results for input(s): HGBA1C in the last 72 hours. ------------------------------------------------------------------------------------------------------------------ No results for input(s): CHOL, HDL, LDLCALC, TRIG, CHOLHDL, LDLDIRECT in the last 72  hours. ------------------------------------------------------------------------------------------------------------------ No results for input(s): TSH, T4TOTAL, T3FREE, THYROIDAB in the last 72 hours.  Invalid input(s): FREET3 ------------------------------------------------------------------------------------------------------------------ No results for input(s): VITAMINB12, FOLATE, FERRITIN, TIBC, IRON, RETICCTPCT in the last 72 hours.  Coagulation profile No results for input(s): INR, PROTIME in the last 168 hours.  No results for input(s): DDIMER in the last 72 hours.  Cardiac Enzymes  Recent Labs Lab 09/29/15 1932 09/30/15 0037 09/30/15 0832  TROPONINI 0.14* 0.11* 0.07*   ------------------------------------------------------------------------------------------------------------------ Invalid input(s): POCBNP   Time Spent in minutes  35   Jamarria Real K M.D on 09/30/2015 at 9:40 AM  Between 7am to 7pm - Pager - [phone]  After 7pm go to [email or web address] - password [facility]  [facility] -  Office  [phone]

## 2015-09-30 NOTE — Plan of Care (Addendum)
Problem: Education: Goal: Knowledge of [facility] Education information/materials will improve Outcome: Progressing Patient education at bedside re:  Plans of care (PE/DVT, UTI)  Medications (IV heparin to PO eliquis, Macrodantin, hydromorphone)  Testing/procedures (AM labs, EKG in AM)  Unit routines  Safety plan  Pain  Activity progression  Discharge plan Patient needs education concerning vaccination for influenza on administration at discharge. Also will require ongoing assessment and education related to plan of care.  Recent vital signs: Today's Vitals   09/30/15 1955 09/30/15 1956 09/30/15 2034 09/30/15 2354  BP:  144/87  119/67  Pulse:  96  93  Temp:  97.8 F (36.6 C)  98.1 F (36.7 C)  TempSrc:  Oral  Oral  Resp:  14  15  Height:      Weight:      SpO2:  94%  95%  PainSc: 6   4      Activity intolerance with evidence of right heart strain:  Dyspnea with mild exertion  Weak peripheral pulses  Low-level tachycardia with mild exertion  Fine bibasilar crackles on auscultation of lung fields at rest  Non-pitting lower extremity edema (sock ring noted bilaterally)  Dull, non-radiating, right-sided chest pressure (increasing with activity) Encouraged mobility to tolerance. Patient is up with assistance to bedside commode for toileting needs, otherwise on bed rest. Continue monitoring for signs of fluid volume overload/worsening failure. Currently well-compensated at rest.  IV heparin infusing per orders. No problems noted with infusion or IV site.  Medicated for right-sided chest pain/pressure.  Continuing to monitor closely.  Problem: Consults Goal: Diagnosis - Venous Thromboembolism (VTE) Choose a selection  Outcome: Completed/Met Date Met:  09/30/15 PE (Pulmonary Embolism) with DVT (Deep Venous Thromboembolism).

## 2015-09-30 NOTE — Progress Notes (Signed)
Echocardiogram 2D Echocardiogram has been performed.  Sarah Leach 09/30/2015, 2:16 PM

## 2015-09-30 NOTE — Progress Notes (Signed)
Pt states she has not voided in 8 hours. Concerned that she has tried twice with no results. Bladder scan performed with 25ml present in bladder. Patient denies any pain or bladder discomfort. [facility] [employer]

## 2015-09-30 NOTE — Progress Notes (Signed)
ANTICOAGULATION CONSULT NOTE  Pharmacy Consult for Heparin Indication: pulmonary embolus  No Known Allergies  Patient Measurements: Height: 5\' 4"  (162.6 cm) Weight: 163 lb 2.3 oz (74 kg) IBW/kg (Calculated) : 54.7 Heparin Dosing Weight: 70 kg  Vital Signs: Temp: 98 F (36.7 C) (12/12 2354) Temp Source: Oral (12/12 2354) BP: 109/66 mmHg (12/12 2354) Pulse Rate: 79 (12/12 2354)  Labs:  Recent Labs  09/29/15 1118 09/29/15 1932 09/30/15 0037  HGB 13.2  --  12.6  HCT 39.7  --  38.3  PLT 303  --  309  HEPARINUNFRC  --   --  0.54  CREATININE 0.93  --  0.93  TROPONINI  --  0.14* 0.11*    Estimated Creatinine Clearance: 84 mL/min (by C-G formula based on Cr of 0.93).  Assessment: 34 yo female with B PE for heparin Goal of Therapy:  Heparin level 0.3-0.7 units/ml Monitor platelets by anticoagulation protocol: Yes   Plan: Continue Heparin at current rate for now Recheck level later this morning to verify  Phillis Knack, PharmD, BCPS  09/30/2015,2:31 AM

## 2015-09-30 NOTE — Care Management Note (Signed)
Case Management Note  Patient Details  Name: Sarah Leach MRN: [id number] Date of Birth: 12/03/1980  Subjective/Objective:  Pt admitted for SOB- found to have PE. Initiated on IV heparin gtt. Pt lives with husband. Pt is without insurance and a job. Husband is out of work as well. Pt is currently using the [employer] $4.00 medication list for generics. CM did speak with pt in regards to PCP Maudie Mercury and pt would like to continue to see. CM did ask Unit Secretary to make Hospital Follow Up within 1-2 weeks.   Action/Plan: CM spoke with pt in regards to Eliquis. CM will provide pt with the 30 day free card. Patient assistance form will be given to pt as well for PCP to fill out for possible year supply free. Pt can then fax to company. CM did consult with CSW in regards to housing- per pt she will need to be out of current home by Oct 09, 2015. No further needs from CM at this time.   Expected Discharge Date:  10/02/15               Expected Discharge Plan:  Home/Self Care  In-House Referral:  Clinical Social Work Patent attorney)  Discharge planning Services  CM Consult, Medication Assistance, Follow-up appt scheduled  Post Acute Care Choice:  NA Choice offered to:  NA  DME Arranged:  N/A DME Agency:  NA  HH Arranged:  NA HH Agency:  NA  Status of Service:  Completed, signed off  Medicare Important Message Given:    Date Medicare IM Given:    Medicare IM give by:    Date Additional Medicare IM Given:    Additional Medicare Important Message give by:     If discussed at [place] of Stay Meetings, dates discussed:    Additional Comments:  Bethena Roys, RN 09/30/2015, 12:07 PM

## 2015-09-30 NOTE — Progress Notes (Signed)
Patient can't void after several attempts. Bladder scan done again and reveals 350cc urine retention in bladder at this time. Paged Baltazar Najjar NP for order to in and out cath. [facility] [employer]

## 2015-09-30 NOTE — Progress Notes (Signed)
Name: Sarah Leach MRN: [id number] DOB: 1981-04-09    ADMISSION DATE:  09/29/2015 CONSULTATION DATE:  09/29/2015  REFERRING MD :  Dr. Olevia Bowens [facility]  CHIEF COMPLAINT:  Chest pain/SOB  BRIEF PATIENT DESCRIPTION: 34 year old female presented to the emergency department 12/12 complaining of her substernal chest pain and shortness of breath. CT angiogram in the emergency department demonstrated pulmonary embolism with moderate to large clot burden and CT diagnosed RV strain with an RV LV ratio of 1. Patient was started on heparin PCCM was consulted.  SIGNIFICANT EVENTS  12/12 admit  STUDIES:  CT angio chest 12/12> Positive for acute PE with CT evidence of right heart strain (RV/LV Ratio = 1.0) Positive for bilateral pulmonary emboli. Large clot burden in the proximal left lower lobar pulmonary artery. There is also clot extending into the left upper lobe segmental branches. Clot extending into some branches of the right upper lobe. Thrombus extending into the right middle lobe and right lower lobe branches. Overall, the clot burden is moderate to large. BLE Dopplers 12/13 > positive for acute deep vein thrombosis involving the right saphenofemoral junction, femoral, profunda femoral, popliteal, posterior tibial, and peroneal veins. There is evidence of mobile thrombus in the right common femoral vein. The left lower extremity exhibits acute superficial vein thrombosis involving the greater saphenous vein at the saphenofemoral junction and the lesser saphenous vein Echo 12/13 >  SUBJECTIVE: Feels somewhat better today.  VITAL SIGNS: Temp:  [97.8 F (36.6 C)-98.2 F (36.8 C)] 97.9 F (36.6 C) (12/13 1134) Pulse Rate:  [77-105] 77 (12/13 0400) Resp:  [16-31] 16 (12/13 0400) BP: (107-142)/(59-97) 112/73 mmHg (12/13 0400) SpO2:  [93 %-100 %] 99 % (12/13 0400) FiO2 (%):  [28 %] 28 % (12/12 2126) Weight:  [74 kg (163 lb 2.3 oz)-74.4 kg (164 lb 0.4 oz)] 74.4 kg (164 lb 0.4 oz) (12/13  0400)  PHYSICAL EXAMINATION: General:  Female of normal body habitus in no acute distress Neuro:  Alert, oriented, nonfocal HEENT:  [place]/AT, no appreciable JVD, PERRL Cardiovascular:  Regular rate and rhythm, no MRG Lungs:  Clear bilateral breath sounds, deep inspiration limited due to chest pain Abdomen:  Soft, nontender, nondistended Musculoskeletal:  No acute deformity or range of motion limitations Skin:  Grossly intact   Recent Labs Lab 09/29/15 1118 09/30/15 0037  NA 142 142  K 2.9* 3.7  CL 110 110  CO2 23 21*  BUN 10 8  CREATININE 0.93 0.93  GLUCOSE 107* 102*    Recent Labs Lab 09/29/15 1118 09/30/15 0037  HGB 13.2 12.6  HCT 39.7 38.3  WBC 8.3 8.3  PLT 303 309   Dg Chest 2 View  09/29/2015  CLINICAL DATA:  Generalized chest pain, onset Saturday. EXAM: CHEST  2 VIEW COMPARISON:  None. FINDINGS: The heart size and mediastinal contours are within normal limits. Both lungs are clear. The visualized skeletal structures are unremarkable. IMPRESSION: No active cardiopulmonary disease. Electronically Signed   By: Rolm Baptise M.D.   On: 09/29/2015 11:37   Ct Angio Chest Pe W/cm &/or Wo Cm  09/29/2015  CLINICAL DATA:  34 year old with generalized chest pain. Pain increases with activity and causes shortness of breath. EXAM: CT ANGIOGRAPHY CHEST WITH CONTRAST TECHNIQUE: Multidetector CT imaging of the chest was performed using the standard protocol during bolus administration of intravenous contrast. Multiplanar CT image reconstructions and MIPs were obtained to evaluate the vascular anatomy. CONTRAST:  169mL OMNIPAQUE IOHEXOL 350 MG/ML SOLN COMPARISON:  Chest radiograph 09/29/2015 FINDINGS: Mediastinum/Lymph Nodes: Positive  for bilateral pulmonary emboli. Large clot burden in the proximal left lower lobar pulmonary artery. There is also clot extending into the left upper lobe segmental branches. Clot extending into some branches of the right upper lobe. Thrombus extending into  the right middle lobe and right lower lobe branches. Overall, the clot burden is moderate to large. RV/LV ratio is approximately 1.0. No significant deviation of the intrerventricular septum. Mid ascending thoracic aorta measures 3.3 cm. Main pulmonary artery measuring 3.4 cm. Great vessels are patent. No significant chest lymphadenopathy. Small amount of pericardial fluid. Lungs/Pleura: No pleural effusions. Trachea and mainstem bronchi are patent. Few scattered ground-glass densities in the lungs but no significant consolidation or airspace disease. Most prominent ground-glass densities are in the left upper lobe. Upper abdomen: Images of the upper abdomen are unremarkable. Musculoskeletal: No acute bone abnormality. Review of the MIP images confirms the above findings. IMPRESSION: Positive for acute PE with CT evidence of right heart strain (RV/LV Ratio = 1.0) consistent with at least submassive (intermediate risk) PE. The presence of right heart strain has been associated with an increased risk of morbidity and mortality. Please activate Code PE by paging [phone]. Critical Value/emergent results were called by telephone at the time of interpretation on 09/29/2015 at 6:07 pm to Dr. Rex Kras , who verbally acknowledged these results. Electronically Signed   By: Markus Daft M.D.   On: 09/29/2015 18:17    ASSESSMENT / PLAN: Discussion: 34 year old female no significant past medical history is demonstrated to have bilateral pulmonary emboli with moderate to large clot burden. PE seems to be unprovoked, as she denies just about every risk factor. She was hemodynamically stable in the emergency department with oxygen saturations in the mid 90s on room air. RV LV ratio on CT is mildly elevated at 1. Continue heparin gtt, will need filter placed for mobile DVT, and then EKOS. Troponin trending down. Lactic negative.  Pulmonary emboli with moderate to large clot burden. Mild RV strain demonstrated on CT. -Heparin  gtt -Echocardiogram pending to assess RV fxn.  -Place IVC filter, then transfer patient to ICU for EKOS therapy -IR consulted -Follow genetic component of hypercoagulable panel as this is seemingly unprovoked and age less than 45 (factor 5, prothrombin gene mutation). Will need to check the remaining hypercoag panel once the patient has been treated for her clot and is off therapy (Protein C, Protein S, Beta 2 glycoprotein, homocysteine, AT III, etc)  Elevated troponin secondary to PE -Trending down, no further workup  UTI on UA, asymptomatic -Defer ABX -Monitor symptoms -Trend WBC and fever curve  Hypokalemia (reolsved) -Follow Bmet and correct electrolytes as indicated   Georgann Housekeeper, AGACNP-BC [facility] Pager [phone] or [phone]  09/30/2015 1:52 PM  Attending Note:  34 year old female with PE with large clot burden and a DVT with a mobile clot.  I discussed the case with IR and patient is a candidate for EKOS program given her clot burden and age.  On exam patient is requiring 2L [place] O2 and lungs are clear.  I reviewed chest CT myself with PE with large clot burden.  I discussed this with the patient extensively regarding treatment options and she agreed to the EKOS.  PE: unprovoked.  - Hypercoagulable panel.  - EKOS.  - Heparin.  - To the ICU post EKOS for close observation.  DVT: with mobile clot.  - IVC filter placement, retrieve in 2-3 months.  Hypoxemia: due to PE.  - Titrate O2  for sat of 92-95%.  Right heart strain due to PE  - EKOS.  - Repeat echo after EKOS is concluded.  Patient seen and examined, agree with above note.  I dictated the care and orders written for this patient under my direction.  Rush Farmer, MD [phone]

## 2015-10-01 DIAGNOSIS — I2602 Saddle embolus of pulmonary artery with acute cor pulmonale: Secondary | ICD-10-CM

## 2015-10-01 DIAGNOSIS — I824Y1 Acute embolism and thrombosis of unspecified deep veins of right proximal lower extremity: Secondary | ICD-10-CM

## 2015-10-01 LAB — CBC WITH DIFFERENTIAL/PLATELET
BASOS ABS: 0 10*3/uL (ref 0.0–0.1)
Basophils Relative: 1 %
EOS PCT: 8 %
Eosinophils Absolute: 0.5 10*3/uL (ref 0.0–0.7)
HCT: 33 % — ABNORMAL LOW (ref 36.0–46.0)
HEMOGLOBIN: 11 g/dL — AB (ref 12.0–15.0)
LYMPHS ABS: 2.2 10*3/uL (ref 0.7–4.0)
LYMPHS PCT: 34 %
MCH: 29.5 pg (ref 26.0–34.0)
MCHC: 33.3 g/dL (ref 30.0–36.0)
MCV: 88.5 fL (ref 78.0–100.0)
Monocytes Absolute: 0.3 10*3/uL (ref 0.1–1.0)
Monocytes Relative: 5 %
NEUTROS PCT: 52 %
Neutro Abs: 3.4 10*3/uL (ref 1.7–7.7)
PLATELETS: 288 10*3/uL (ref 150–400)
RBC: 3.73 MIL/uL — AB (ref 3.87–5.11)
RDW: 13.5 % (ref 11.5–15.5)
WBC: 6.5 10*3/uL (ref 4.0–10.5)

## 2015-10-01 LAB — COMPREHENSIVE METABOLIC PANEL
ALT: 12 U/L — ABNORMAL LOW (ref 14–54)
ANION GAP: 7 (ref 5–15)
AST: 26 U/L (ref 15–41)
Albumin: 3 g/dL — ABNORMAL LOW (ref 3.5–5.0)
Alkaline Phosphatase: 52 U/L (ref 38–126)
BILIRUBIN TOTAL: 0.6 mg/dL (ref 0.3–1.2)
BUN: 6 mg/dL (ref 6–20)
CHLORIDE: 110 mmol/L (ref 101–111)
CO2: 24 mmol/L (ref 22–32)
Calcium: 8.1 mg/dL — ABNORMAL LOW (ref 8.9–10.3)
Creatinine, Ser: 0.9 mg/dL (ref 0.44–1.00)
Glucose, Bld: 107 mg/dL — ABNORMAL HIGH (ref 65–99)
POTASSIUM: 3.5 mmol/L (ref 3.5–5.1)
SODIUM: 141 mmol/L (ref 135–145)
TOTAL PROTEIN: 5.6 g/dL — AB (ref 6.5–8.1)

## 2015-10-01 LAB — HOMOCYSTEINE: HOMOCYSTEINE-NORM: 11.5 umol/L (ref 0.0–15.0)

## 2015-10-01 LAB — HEPARIN LEVEL (UNFRACTIONATED): HEPARIN UNFRACTIONATED: 0.81 [IU]/mL — AB (ref 0.30–0.70)

## 2015-10-01 MED ORDER — PROMETHAZINE HCL 25 MG PO TABS
12.5000 mg | ORAL_TABLET | Freq: Once | ORAL | Status: AC
  Administered 2015-10-01: 12.5 mg via ORAL
  Filled 2015-10-01: qty 1

## 2015-10-01 MED ORDER — APIXABAN 5 MG PO TABS
10.0000 mg | ORAL_TABLET | Freq: Two times a day (BID) | ORAL | Status: DC
  Administered 2015-10-01 – 2015-10-03 (×5): 10 mg via ORAL
  Filled 2015-10-01 (×5): qty 2

## 2015-10-01 MED ORDER — APIXABAN 5 MG PO TABS: 5.0000 mg | ORAL_TABLET | Freq: Two times a day (BID) | ORAL | Status: DC

## 2015-10-01 NOTE — Progress Notes (Signed)
[facility] Progress Note Patient Name: Jamilex Hagelstein DOB: Jan 15, 1981 MRN: [id number]   Date of Service  10/01/2015  HPI/Events of Note  nausea  eICU Interventions  Phenergan 12.5mg  PO x 1     Intervention Category Intermediate Interventions: Other:  Trevonte Ashkar 10/01/2015, 2:26 AM

## 2015-10-01 NOTE — Progress Notes (Signed)
PULMONARY / CRITICAL CARE MEDICINE   Name: Sarah Leach MRN: [id number] DOB: [birth year]    ADMISSION DATE:  09/29/2015 CONSULTATION DATE: 09/29/2015  REFERRING MD:  Olevia Bowens  CHIEF COMPLAINT:  Short of breath  SUBJECTIVE:  Still has mild chest discomfort.  VITAL SIGNS: BP 105/63 mmHg  Pulse 67  Temp(Src) 98.4 F (36.9 C) (Oral)  Resp 12  Ht 5\' 4"  (1.626 m)  Wt 163 lb 14.4 oz (74.345 kg)  BMI 28.12 kg/m2  SpO2 100%  LMP 09/22/2015  INTAKE / OUTPUT: I/O last 3 completed shifts: In: 1387.7 [P.O.:660; I.V.:727.7] Out: 1300 [Urine:1300]  PHYSICAL EXAMINATION: General:  pleasant Neuro:  Normal strength HEENT:  No sinus tenderness Cardiovascular:  Regular, no murmur Lungs:  No wheeze Abdomen:  Soft, non tender Musculoskeletal:  1+ ankle edema Rt leg Skin:  No rashes  LABS:  BMET  Recent Labs Lab 09/29/15 1118 09/30/15 0037 10/01/15 0308  NA 142 142 141  K 2.9* 3.7 3.5  CL 110 110 110  CO2 23 21* 24  BUN 10 8 6   CREATININE 0.93 0.93 0.90  GLUCOSE 107* 102* 107*    Electrolytes  Recent Labs Lab 09/29/15 1118 09/29/15 1932 09/30/15 0037 10/01/15 0308  CALCIUM 8.8*  --  8.3* 8.1*  MG  --  2.0  --   --   PHOS  --  3.1  --   --     CBC  Recent Labs Lab 09/29/15 1118 09/30/15 0037 10/01/15 0308  WBC 8.3 8.3 6.5  HGB 13.2 12.6 11.0*  HCT 39.7 38.3 33.0*  PLT 303 309 288    Coag's No results for input(s): APTT, INR in the last 168 hours.  Sepsis Markers  Recent Labs Lab 09/29/15 2156  LATICACIDVEN 1.3   Liver Enzymes  Recent Labs Lab 09/30/15 0037 10/01/15 0308  AST 20 26  ALT 11* 12*  ALKPHOS 58 52  BILITOT 0.8 0.6  ALBUMIN 3.2* 3.0*    Cardiac Enzymes  Recent Labs Lab 09/29/15 1932 09/30/15 0037 09/30/15 0832  TROPONINI 0.14* 0.11* 0.07*   Imaging No results found.   STUDIES:  12/12 CT chest >> b/l PE with RV:LV ratio 1 12/13 Doppler legs b/l >> Rt saphenofemoral junction, femoral, profunda femoral, popliteal,  posterior tibial and peroneal vein acute DVT.  Lt superficial vein thrombosis greater saphenous vein and lesser saphenous vein 12/13 Echo >> EF 55 to 606, mild AI, mild RV dilation, PAS 39 mmHg  SIGNIFICANT EVENTS: 12/12 Admit 12/13 IR consulted 12/14 Start eliquis  DISCUSSION: 34 yo female with dyspnea and chest pain unprovoked PE/DVT.  Has evidence for mild Rt heart strain, but not enough to warrant EKOS.  Not candidate for IVC filter per IR.  ASSESSMENT / PLAN:  Acute PE with Rt leg DVT >> no obvious cause. Plan: - start eliquis and transition off heparin gtt - f/u hypercoagulable panel from 12/12 - ambulate and monitor clinical status  Might be ready for d/c home 12/15.  Updated pt's husband at bedside.  Chesley Mires, MD [facility] Pulmonary/Critical Care 10/01/2015, 10:27 AM Pager:  [phone] After 3pm call: [phone]

## 2015-10-01 NOTE — ED Provider Notes (Signed)
I received this patient in signout from Dr. Eulis Foster. Per his report, pt had presented w/ several days of CP associated w/ SOB on exertion, no major risk factors for clot but troponin elevated at 0.15 and 0.14 on repeat 3 hr later, therefore concern for PE. Obtained CTA chest which showed PE w/ evidence of R heart strain. I performed bedside echo which showed enlarged R ventricle. Pt was stable at 93-95% on room air, normotensive. Informed of test results and need to start anticoagulation. Ordered heparin drip and discussed with critical care, Dr. Corinna Lines, who will send CC team to evaluate patient to consider whether she needs further intervention such as embolectomy. Discussed with triad hospitalist and patient admitted to stepdown for further care.  CRITICAL CARE Performed by: Wenda Overland Little   Total critical care time: 30 minutes  Critical care time was exclusive of separately billable procedures and treating other patients.  Critical care was necessary to treat or prevent imminent or life-threatening deterioration.  Critical care was time spent personally by me on the following activities: development of treatment plan with patient and/or surrogate as well as nursing, discussions with consultants, evaluation of patient's response to treatment, examination of patient, obtaining history from patient or surrogate, ordering and performing treatments and interventions, ordering and review of laboratory studies, ordering and review of radiographic studies, pulse oximetry and re-evaluation of patient's condition.   Sharlett Iles, MD 10/01/15 [phone]

## 2015-10-01 NOTE — Progress Notes (Deleted)
Not candidate for catheter directed thrombolytics.  IR arranging for IVC filter.  PCCM will sign off.  Please call if additional help needed.  Chesley Mires, MD [facility] Pulmonary/Critical Care 10/01/2015, 9:17 AM Pager:  [phone] After 3pm call: [phone]

## 2015-10-01 NOTE — Discharge Instructions (Addendum)
Information on my medicine - ELIQUIS (apixaban)  Why was Eliquis prescribed for you? Eliquis was prescribed to treat blood clots that may have been found in the veins of your legs (deep vein thrombosis) or in your lungs (pulmonary embolism) and to reduce the risk of them occurring again.  What do You need to know about Eliquis ? The starting dose is 10 mg (two 5 mg tablets) taken TWICE daily for the FIRST SEVEN (7) DAYS, then on   10/08/15  the dose is reduced to ONE 5 mg tablet taken TWICE daily.  Eliquis may be taken with or without food.   Try to take the dose about the same time in the morning and in the evening. If you have difficulty swallowing the tablet whole please discuss with your pharmacist how to take the medication safely.  Take Eliquis exactly as prescribed and DO NOT stop taking Eliquis without talking to the doctor who prescribed the medication.  Stopping may increase your risk of developing a new blood clot.  Refill your prescription before you run out.  After discharge, you should have regular check-up appointments with your healthcare provider that is prescribing your Eliquis.    What do you do if you miss a dose? If a dose of ELIQUIS is not taken at the scheduled time, take it as soon as possible on the same day and twice-daily administration should be resumed. The dose should not be doubled to make up for a missed dose.  Important Safety Information A possible side effect of Eliquis is bleeding. You should call your healthcare provider right away if you experience any of the following: ? Bleeding from an injury or your nose that does not stop. ? Unusual colored urine (red or dark brown) or unusual colored stools (red or black). ? Unusual bruising for unknown reasons. ? A serious fall or if you hit your head (even if there is no bleeding).  Some medicines may interact with Eliquis and might increase your risk of bleeding or clotting while on Eliquis. To help  avoid this, consult your healthcare provider or pharmacist prior to using any new prescription or non-prescription medications, including herbals, vitamins, non-steroidal anti-inflammatory drugs (NSAIDs) and supplements.  This website has more information on Eliquis (apixaban): http://www.eliquis.com/eliquis/home   Foley Catheter Care, Adult A Foley catheter is a soft, flexible tube. This tube is placed into your bladder to drain pee (urine). If you go home with this catheter in place, follow the instructions below. TAKING CARE OF THE CATHETER 1. Wash your hands with soap and water. 2. Put soap and water on a clean washcloth.  Clean the skin where the tube goes into your body.  Clean away from the tube site.  Never wipe toward the tube.  Clean the area using a circular motion.  Remove all the soap. Pat the area dry with a clean towel. For males, reposition the skin that covers the end of the penis (foreskin). 3. Attach the tube to your leg with tape or a leg strap. Do not stretch the tube tight. If you are using tape, remove any stickiness left behind by past tape you used. 4. Keep the drainage bag below your hips. Keep it off the floor. 5. Check your tube during the day. Make sure it is working and draining. Make sure the tube does not curl, twist, or bend. 6. Do not pull on the tube or try to take it out. TAKING CARE OF THE DRAINAGE BAGS You will have  a large overnight drainage bag and a small leg bag. You may wear the overnight bag any time. Never wear the small bag at night. Follow the directions below. Emptying the Drainage Bag Empty your drainage bag when it is  - full or at least 2-3 times a day. 1. Wash your hands with soap and water. 2. Keep the drainage bag below your hips. 3. Hold the dirty bag over the toilet or clean container. 4. Open the pour spout at the bottom of the bag. Empty the pee into the toilet or container. Do not let the pour spout touch anything. 5. Clean  the pour spout with a gauze pad or cotton ball that has rubbing alcohol on it. 6. Close the pour spout. 7. Attach the bag to your leg with tape or a leg strap. 8. Wash your hands well. Changing the Drainage Bag Change your bag once a month or sooner if it starts to smell or look dirty.  1. Wash your hands with soap and water. 2. Pinch the rubber tube so that pee does not spill out. 3. Disconnect the catheter tube from the drainage tube at the connection valve. Do not let the tubes touch anything. 4. Clean the end of the catheter tube with an alcohol wipe. Clean the end of a the drainage tube with a different alcohol wipe. 5. Connect the catheter tube to the drainage tube of the clean drainage bag. 6. Attach the new bag to the leg with tape or a leg strap. Avoid attaching the new bag too tightly. 7. Wash your hands well. Cleaning the Drainage Bag 1. Wash your hands with soap and water. 2. Wash the bag in warm, soapy water. 3. Rinse the bag with warm water. 4. Fill the bag with a mixture of white vinegar and water (1 cup vinegar to 1 quart warm water [.2 liter vinegar to 1 liter warm water]). Close the bag and soak it for 30 minutes in the solution. 5. Rinse the bag with warm water. 6. Hang the bag to dry with the pour spout open and hanging downward. 7. Store the clean bag (once it is dry) in a clean plastic bag. 8. Wash your hands well. PREVENT INFECTION  Wash your hands before and after touching your tube.  Take showers every day. Wash the skin where the tube enters your body. Do not take baths. Replace wet leg straps with dry ones, if this applies.  Do not use powders, sprays, or lotions on the genital area. Only use creams, lotions, or ointments as told by your doctor.  For females, wipe from front to back after going to the bathroom.  Drink enough fluids to keep your pee clear or pale yellow unless you are told not to have too much fluid (fluid restriction).  Do not let the  drainage bag or tubing touch or lie on the floor.  Wear cotton underwear to keep the area dry. GET HELP IF:  Your pee is cloudy or smells unusually bad.  Your tube becomes clogged.  You are not draining pee into the bag or your bladder feels full.  Your tube starts to leak. GET HELP RIGHT AWAY IF:  You have pain, puffiness (swelling), redness, or yellowish-white fluid (pus) where the tube enters the body.  You have pain in the belly (abdomen), legs, lower back, or bladder.  You have a fever.  You see blood fill the tube, or your pee is pink or red.  You feel sick to  your stomach (nauseous), throw up (vomit), or have chills.  Your tube gets pulled out. MAKE SURE YOU:   Understand these instructions.  Will watch your condition.  Will get help right away if you are not doing well or get worse.   This information is not intended to replace advice given to you by your health care provider. Make sure you discuss any questions you have with your health care provider.   Document Released: 01/29/2013 Document Revised: 10/25/2014 Document Reviewed: 01/29/2013 Elsevier Interactive Patient Education [employer].

## 2015-10-01 NOTE — Progress Notes (Signed)
ANTICOAGULATION CONSULT NOTE - Follow-up Consult  Pharmacy Consult for Heparin Indication: pulmonary embolus  No Known Allergies  Patient Measurements: Height: 5\' 4"  (162.6 cm) Weight: 164 lb 0.4 oz (74.4 kg) IBW/kg (Calculated) : 54.7 Heparin Dosing Weight: 70 kg  Vital Signs: Temp: 98.1 F (36.7 C) (12/13 2354) Temp Source: Oral (12/13 2354) BP: 119/67 mmHg (12/13 2354) Pulse Rate: 93 (12/13 2354)  Labs:  Recent Labs  09/29/15 1118 09/29/15 1932 09/30/15 0037 09/30/15 0832 10/01/15 0308  HGB 13.2  --  12.6  --  11.0*  HCT 39.7  --  38.3  --  33.0*  PLT 303  --  309  --  288  HEPARINUNFRC  --   --  0.54 0.55 0.81*  CREATININE 0.93  --  0.93  --  0.90  TROPONINI  --  0.14* 0.11* 0.07*  --     Estimated Creatinine Clearance: 87 mL/min (by C-G formula based on Cr of 0.9).   Assessment: 34 yo female on heparin for acute bilateral PE with R heart strain. Heparin level now supratherapeutic at 0.81. Hgb down a bit, plt ok. No bleeding noted.  Goal of Therapy:  Heparin level 0.3-0.7 units/ml Monitor platelets by anticoagulation protocol: Yes   Plan:  -Decrease heparin gtt to 1100 units/hr - small decrease as pt with large clot burden and would like at upper end of therapeutic range -F/u 6 hr heparin level -F/u plan for PO anticoagulation  Sherlon Handing, PharmD, BCPS Clinical pharmacist, pager [phone] 10/01/2015 4:41 AM

## 2015-10-02 LAB — COMPREHENSIVE METABOLIC PANEL
ALBUMIN: 2.7 g/dL — AB (ref 3.5–5.0)
ALK PHOS: 43 U/L (ref 38–126)
ALT: 11 U/L — AB (ref 14–54)
AST: 22 U/L (ref 15–41)
Anion gap: 6 (ref 5–15)
BILIRUBIN TOTAL: 0.7 mg/dL (ref 0.3–1.2)
BUN: 7 mg/dL (ref 6–20)
CALCIUM: 8.1 mg/dL — AB (ref 8.9–10.3)
CO2: 24 mmol/L (ref 22–32)
CREATININE: 0.89 mg/dL (ref 0.44–1.00)
Chloride: 109 mmol/L (ref 101–111)
GFR calc Af Amer: >60 mL/min (ref >60)
GLUCOSE: 90 mg/dL (ref 65–99)
POTASSIUM: 3.5 mmol/L (ref 3.5–5.1)
Sodium: 139 mmol/L (ref 135–145)
TOTAL PROTEIN: 5.1 g/dL — AB (ref 6.5–8.1)

## 2015-10-02 LAB — CBC WITH DIFFERENTIAL/PLATELET
BASOS ABS: 0 10*3/uL (ref 0.0–0.1)
BASOS PCT: 1 %
Eosinophils Absolute: 0.4 10*3/uL (ref 0.0–0.7)
Eosinophils Relative: 6 %
HEMATOCRIT: 33.8 % — AB (ref 36.0–46.0)
HEMOGLOBIN: 10.6 g/dL — AB (ref 12.0–15.0)
LYMPHS PCT: 22 %
Lymphs Abs: 1.4 10*3/uL (ref 0.7–4.0)
MCH: 27.9 pg (ref 26.0–34.0)
MCHC: 31.4 g/dL (ref 30.0–36.0)
MCV: 88.9 fL (ref 78.0–100.0)
Monocytes Absolute: 0.4 10*3/uL (ref 0.1–1.0)
Monocytes Relative: 6 %
NEUTROS ABS: 4.1 10*3/uL (ref 1.7–7.7)
NEUTROS PCT: 65 %
Platelets: 311 10*3/uL (ref 150–400)
RBC: 3.8 MIL/uL — AB (ref 3.87–5.11)
RDW: 13.5 % (ref 11.5–15.5)
WBC: 6.2 10*3/uL (ref 4.0–10.5)

## 2015-10-02 LAB — CARDIOLIPIN ANTIBODIES, IGG, IGM, IGA
Anticardiolipin IgA: <9 APL U/mL (ref 0–11)
Anticardiolipin IgM: <9 MPL U/mL (ref 0–12)

## 2015-10-02 LAB — PROTEIN S ACTIVITY: PROTEIN S ACTIVITY: 45 % — AB (ref 63–140)

## 2015-10-02 LAB — PROTEIN S, TOTAL: PROTEIN S AG TOTAL: 92 % (ref 60–150)

## 2015-10-02 LAB — BETA-2-GLYCOPROTEIN I ABS, IGG/M/A: Beta-2-Glycoprotein I IgM: <9 GPI IgM units (ref 0–32)

## 2015-10-02 LAB — PROTEIN C, TOTAL: Protein C, Total: 90 % (ref 60–150)

## 2015-10-02 LAB — PROTEIN C ACTIVITY: PROTEIN C ACTIVITY: 131 % (ref 73–180)

## 2015-10-02 NOTE — Progress Notes (Signed)
PULMONARY / CRITICAL CARE MEDICINE   Name: Sarah Leach MRN: [id number] DOB: Dec 06, 1980    ADMISSION DATE:  09/29/2015 CONSULTATION DATE: 09/29/2015  REFERRING MD:  Olevia Bowens  CHIEF COMPLAINT:  Short of breath  SUBJECTIVE:  Still has mild chest discomfort.  Felt dizzy when walking yesterday.  VITAL SIGNS: BP 114/73 mmHg  Pulse 84  Temp(Src) 98.3 F (36.8 C) (Oral)  Resp 18  Ht 5\' 4"  (1.626 m)  Wt 162 lb 6.4 oz (73.664 kg)  BMI 27.86 kg/m2  SpO2 97%  LMP 09/22/2015  INTAKE / OUTPUT: I/O last 3 completed shifts: In: 1499.4 [P.O.:1200; I.V.:299.4] Out: 600 [Urine:600]  PHYSICAL EXAMINATION: General:  Pleasant, laying in bed Neuro:  Normal strength HEENT:  No sinus tenderness Cardiovascular:  Regular, no murmur Lungs:  No wheeze Abdomen:  Soft, non tender Musculoskeletal:  1+ ankle edema Rt leg Skin:  No rashes  LABS:  BMET  Recent Labs Lab 09/30/15 0037 10/01/15 0308 10/02/15 0456  NA 142 141 139  K 3.7 3.5 3.5  CL 110 110 109  CO2 21* 24 24  BUN 8 6 7   CREATININE 0.93 0.90 0.89  GLUCOSE 102* 107* 90    Electrolytes  Recent Labs Lab 09/29/15 1932 09/30/15 0037 10/01/15 0308 10/02/15 0456  CALCIUM  --  8.3* 8.1* 8.1*  MG 2.0  --   --   --   PHOS 3.1  --   --   --     CBC  Recent Labs Lab 09/30/15 0037 10/01/15 0308 10/02/15 0456  WBC 8.3 6.5 6.2  HGB 12.6 11.0* 10.6*  HCT 38.3 33.0* 33.8*  PLT 309 288 311    Coag's No results for input(s): APTT, INR in the last 168 hours.  Sepsis Markers  Recent Labs Lab 09/29/15 2156  LATICACIDVEN 1.3   Liver Enzymes  Recent Labs Lab 09/30/15 0037 10/01/15 0308 10/02/15 0456  AST 20 26 22   ALT 11* 12* 11*  ALKPHOS 58 52 43  BILITOT 0.8 0.6 0.7  ALBUMIN 3.2* 3.0* 2.7*    Cardiac Enzymes  Recent Labs Lab 09/29/15 1932 09/30/15 0037 09/30/15 0832  TROPONINI 0.14* 0.11* 0.07*   Imaging No results found.   STUDIES:  12/12 CT chest >> b/l PE with RV:LV ratio 1 12/13  Doppler legs b/l >> Rt saphenofemoral junction, femoral, profunda femoral, popliteal, posterior tibial and peroneal vein acute DVT.  Lt superficial vein thrombosis greater saphenous vein and lesser saphenous vein 12/13 Echo >> EF 55 to 606, mild AI, mild RV dilation, PAS 39 mmHg  SIGNIFICANT EVENTS: 12/12 Admit 12/13 IR consulted 12/14 Start eliquis  DISCUSSION: 34 yo female with dyspnea and chest pain unprovoked PE/DVT.  Has evidence for mild Rt heart strain, but not enough to warrant EKOS.  Not candidate for IVC filter per IR.  ASSESSMENT / PLAN:  Acute PE with Rt leg DVT >> no obvious cause. Plan: - continue eliquis - f/u hypercoagulable panel from 12/12 - ambulate and monitor clinical status >> not stable enough on 12/15  Would like her to be more stable with ambulation, and have another full day of eliquis before discharge.  Likely will be ready for d/c home 12/16.   Chesley Mires, MD [facility] Pulmonary/Critical Care 10/02/2015, 8:47 AM Pager:  [phone] After 3pm call: [phone]

## 2015-10-02 NOTE — Progress Notes (Signed)
ANTICOAGULATION CONSULT NOTE - Initial Consult  Pharmacy Consult for Eliquis Indication: pulmonary embolus  No Known Allergies  Patient Measurements: Height: 5\' 4"  (162.6 cm) Weight: 162 lb 6.4 oz (73.664 kg) IBW/kg (Calculated) : 54.7 Heparin Dosing Weight:   Vital Signs: Temp: 98.3 F (36.8 C) (12/15 0542) Temp Source: Oral (12/15 0542) BP: 114/73 mmHg (12/15 0542) Pulse Rate: 84 (12/15 0542)  Labs:  Recent Labs  09/29/15 1932 09/30/15 0037 09/30/15 0832 10/01/15 0308 10/02/15 0456  HGB  --  12.6  --  11.0* 10.6*  HCT  --  38.3  --  33.0* 33.8*  PLT  --  309  --  288 311  HEPARINUNFRC  --  0.54 0.55 0.81*  --   CREATININE  --  0.93  --  0.90 0.89  TROPONINI 0.14* 0.11* 0.07*  --   --     Estimated Creatinine Clearance: 87.6 mL/min (by C-G formula based on Cr of 0.89).   Medical History: Past Medical History  Diagnosis Date  . Pure hypercholesterolemia 08/16/2014  . Osteoarthritis of knees 01/16/2015  . Bilateral headaches 01/16/2015    -chronic -associated with her menstrual periods   . Anemia     resolved, mild for some time - does give plasma twice per week which is likely etiology    Medications:  See EMR   Assessment: 34 yo female with history of HLD, admitted with SOB and chest pain. CTA shows PE with R heart strain RV/LV ration = 1. Patient was transitioned from heparin to Eliquis yesterday. CBC stable.   Goal of Therapy:  Monitor platelets by anticoagulation protocol: Yes    Plan:  -Eliquis 10 mg po bid x 7 days, begin 5 mg po bid on Dec 21 -Monitor s/sx bleeding -Patient has been educated   Hughes Better M 10/02/2015,8:47 AM

## 2015-10-02 NOTE — Plan of Care (Signed)
Problem: Phase III Progression Outcomes Goal: Activity at appropriate level-compared to baseline (UP IN CHAIR FOR HEMODIALYSIS)  Outcome: Completed/Met Date Met:  10/02/15 Patient up in chair, tolerated.

## 2015-10-02 NOTE — Clinical Social Work Note (Signed)
CSW checked in with patient re housing concerns. At this time the patient and her husband plan to followup with the [facility] on 12/22 to seek emergency shelter. The patient and spouse use the bus system for transport and have bus passes. Patient's spouse was given two meal vouchers by CSW. CSW signing off at this time.   Liz Beach MSW, [place], [place], [id number]

## 2015-10-03 MED ORDER — APIXABAN 5 MG PO TABS: 5.0000 mg | ORAL_TABLET | Freq: Two times a day (BID) | ORAL | Status: DC

## 2015-10-03 MED ORDER — NITROFURANTOIN MACROCRYSTAL 100 MG PO CAPS
100.0000 mg | ORAL_CAPSULE | Freq: Two times a day (BID) | ORAL | Status: DC
Start: 2015-10-03 — End: 2015-10-16

## 2015-10-03 MED ORDER — APIXABAN 5 MG PO TABS: 10.0000 mg | ORAL_TABLET | Freq: Two times a day (BID) | ORAL | Status: DC

## 2015-10-03 NOTE — Discharge Summary (Signed)
Physician Discharge Summary       Patient ID: Sarah Leach MRN: [id number] DOB/AGE: [birth year] 34 y.o.  Admit date: 09/29/2015 Discharge date: 10/03/2015  Discharge Diagnoses:  Acute Pulmonary Emboli  Right Lower Extremity Deep Vein Thrombosis  UTI (NOS) Detailed Hospital Course:   34 year old female with past medical history as below, which includes hypercholesterolemia, headaches, arthritis. She has surgical history which includes tubal ligation. She was in her usual state of health until a few days prior to admission when she noticed some numbness in her left lower extremity adenopathy much of it. She did not notice any edema, redness, or pain to the lower extremity. Saturday 12/10 when she first described substernal chest pain and shortness of breath. This became progressively worse over the following days and so she presented to [facility] emergency department 12/12 complaining of severe substernal chest pain 9 out of 10. Pain was described as someone sitting on her chest. She also complained of shortness of breath. She was hemodynamically stable in the emergency department with oxygen saturations in the mid 90s on room air. EKG showed T-wave inversions in leads II, III, avF, V4-6 which are new compared to the previous EKG in 02/2014. Troponin was also noted to be elevated. She was taken for CT angiogram with consideration for PE which demonstrated at least submassive PE with moderate to large clot burden as outlined above. She was started on heparin infusion and admitted to the hospitalist team. Of note an incidental finding on ER evaluation was an UA that was c/w UTI so she was started on Macrodantin for this. PCCM to see in consultation. She was transferred to the ICU service and moved to the intensive care for consideration of catheter directed TPA. Patient was entered into the [facility] PERT algorithm for possible catheter directed lysis. She was seen by Dr Earleen Newport with interventional  radiology who stated the following:  "Patient is currently stratified as "Low Intermediate-risk" (Defined by sPESI = 0, with + biomarkers / + ECHO/CT). Continued anti-coagulation and no CDT is recommended.  Regarding IVC filter, the American [employer] guidelines recommend against IVC filter placement in patients receiving anti-coagulation (1B recommendations)". She was therefore started on NOAC in form of Eliquis on 12/15. She was held for d/c on that day has she felt light headed when standing. Over the course of the day this improved and she was deemed stable for d.c as of 12/16 w/ plans for f/u with PCP.    Discharge Plan by active problems  Pulmonary/Right LE DVT Plan Eliquis X 6 months.  F/u remainder of hypercoag panel  Can see Pulm If needed.   UTI Plan Complete 3 more days Macrodantin     Significant Hospital tests/ studies  Consults: interventional radiology   CT angio chest 12/12> Positive for acute PE with CT evidence of right heart strain (RV/LV Ratio = 1.0) Positive for bilateral pulmonary emboli. Large clot burden in the proximal left lower lobar pulmonary artery. There is also clot extending into the left upper lobe segmental branches. Clot extending into some branches of the right upper lobe. Thrombus extending into the right middle lobe and right lower lobe branches. Overall, the clot burden is moderate to large. BLE Dopplers 12/13 > positive for acute deep vein thrombosis involving the right saphenofemoral junction, femoral, profunda femoral, popliteal, posterior tibial, and peroneal veins. There is evidence of mobile thrombus in the right common femoral vein. The left lower extremity exhibits acute superficial vein thrombosis involving the greater saphenous vein  at the saphenofemoral junction and the lesser saphenous vein 12/13 Echo >> EF 55 to 606, mild AI, mild RV dilation, PAS 39 mmHg  Discharge Exam: BP 111/71 mmHg  Pulse 79  Temp(Src) 98.5 F (36.9 C)  (Oral)  Resp 18  Ht 5\' 4"  (1.626 m)  Wt 73.846 kg (162 lb 12.8 oz)  BMI 27.93 kg/m2  SpO2 95%  LMP 09/22/2015 Room air  General: Pleasant, laying in bed Neuro: Normal strength HEENT: No sinus tenderness Cardiovascular: Regular, no murmur Lungs: No wheeze Abdomen: Soft, non tender Musculoskeletal: 1+ ankle edema Rt leg Skin: No rashes  Labs at discharge Lab Results  Component Value Date   CREATININE 0.89 10/02/2015   BUN 7 10/02/2015   NA 139 10/02/2015   K 3.5 10/02/2015   CL 109 10/02/2015   CO2 24 10/02/2015   Lab Results  Component Value Date   WBC 6.2 10/02/2015   HGB 10.6* 10/02/2015   HCT 33.8* 10/02/2015   MCV 88.9 10/02/2015   PLT 311 10/02/2015   Lab Results  Component Value Date   ALT 11* 10/02/2015   AST 22 10/02/2015   ALKPHOS 43 10/02/2015   BILITOT 0.7 10/02/2015   Lab Results  Component Value Date   INR 0.95 09/22/2013    Current radiology studies No results found.  Disposition:  01-Home or Self Care      Discharge Instructions    Diet - low sodium heart healthy    Complete by:  As directed      Increase activity slowly    Complete by:  As directed             Medication List    TAKE these medications        apixaban 5 MG Tabs tablet  Commonly known as:  ELIQUIS  Take 2 tablets (10 mg total) by mouth 2 (two) times daily.     apixaban 5 MG Tabs tablet  Commonly known as:  ELIQUIS  Take 1 tablet (5 mg total) by mouth 2 (two) times daily.  Start taking on:  10/14/2015     nitrofurantoin 100 MG capsule  Commonly known as:  MACRODANTIN  Take 1 capsule (100 mg total) by mouth every 12 (twelve) hours.       Follow-up Information    Follow up with Colin Benton R., DO On 10/16/2015.   Specialty:  Family Medicine   Why:  915am   Contact information:   [place] [address] [phone]       Follow up with [facility], NI, MD. Schedule an appointment as soon as possible for a visit in 1 week.     Specialty:  Hematology and Oncology   Why:  hypercoaguable workup   Contact information:   [place] [id number] [phone]       Follow up with [facility]. Schedule an appointment as soon as possible for a visit in 1 week.   Contact information:   [address] [place] [phone]      Discharged Condition: good  Physician Statement:   The Patient was personally examined, the discharge assessment and plan has been personally reviewed and I agree with ACNP Bliss Tsang's assessment and plan. > 30 minutes of time have been dedicated to discharge assessment, planning and discharge instructions.   Signed: Clementeen Graham 10/03/2015, 9:22 AM

## 2015-10-03 NOTE — Progress Notes (Addendum)
Nursing reported frequently requiring I&O caths.  Asked them to check bladder scan prior to dc.  Pt did void, but then had 480 ML residual urine in bladder.  Reports having trouble voiding   Spoke w/ Dr Pilar Jarvis w/ [facility]. He recommended placing Urinary cath and discharging to home w/ one week f/u at [facility].   Plan Place foley Check UC F/u with [facility] next week. They will call her w/ appointment.   Erick Colace ACNP-BC [place] Pager # [phone] OR # [phone] if no answer

## 2015-10-03 NOTE — Discharge Summary (Signed)
Addendum  See prior d/c summary Add  Urinary retention to d/c list  DC dx: Acute Pulmonary Emboli  Right Lower Extremity Deep Vein Thrombosis  UTI (NOS) Urinary retention  Additional Hospital Course Nursing reported frequently requiring I&O caths.  Asked them to check bladder scan prior to dc.  Pt did void, but then had 480 ML residual urine in bladder.  Reports having trouble voiding   Spoke w/ Dr Pilar Jarvis w/ [facility]. He recommended placing Urinary cath and discharging to home w/ one week f/u at [facility].   Plan Place foley Check UC F/u with [facility] next week. They will call her w/ appointment.

## 2015-10-04 LAB — URINE CULTURE
Culture: NO GROWTH
SPECIAL REQUESTS: NORMAL

## 2015-10-06 ENCOUNTER — Telehealth: Payer: Self-pay | Admitting: Family Medicine

## 2015-10-06 LAB — FACTOR 5 LEIDEN

## 2015-10-06 LAB — PROTHROMBIN GENE MUTATION

## 2015-10-06 NOTE — Telephone Encounter (Signed)
Error

## 2015-10-16 ENCOUNTER — Ambulatory Visit (INDEPENDENT_AMBULATORY_CARE_PROVIDER_SITE_OTHER): Payer: Self-pay | Admitting: Family Medicine

## 2015-10-16 ENCOUNTER — Encounter: Payer: Self-pay | Admitting: Family Medicine

## 2015-10-16 ENCOUNTER — Telehealth: Payer: Self-pay | Admitting: Hematology and Oncology

## 2015-10-16 VITALS — BP 118/72 | HR 61 | Temp 97.3°F | Ht 64.0 in | Wt 169.5 lb

## 2015-10-16 DIAGNOSIS — D6859 Other primary thrombophilia: Secondary | ICD-10-CM

## 2015-10-16 DIAGNOSIS — I2699 Other pulmonary embolism without acute cor pulmonale: Secondary | ICD-10-CM

## 2015-10-16 MED ORDER — APIXABAN 5 MG PO TABS: 5.0000 mg | ORAL_TABLET | Freq: Two times a day (BID) | ORAL | Status: DC

## 2015-10-16 NOTE — Patient Instructions (Signed)
Before you leave: -meet with our office supervisor Estill Bamberg for assistance) -follow up appointment in 3 months  -We placed a referral for you as discussed to the hematologist. We have also provided you with the number to call for this appointment.

## 2015-10-16 NOTE — Addendum Note (Signed)
Addended by: Agnes Lawrence on: 10/16/2015 10:07 AM   Modules accepted: Orders

## 2015-10-16 NOTE — Progress Notes (Signed)
HPI:  Sarah Leach is a pleasant 34 yo F here for a hospital follow up.  Hospitalized 09/29/15-10/03/15 for a DVT, Pulmonary emboli and a UTI. She was treated with heparin and and then Eliquis on 12/15. She is to follow up with Dr. Alvy Bimler in hematology regarding a hypercoaguable workup. 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 She reports: she is "doing so much better." Denies SOB unless doing heavy housework, fevers, malaise, chest pain, bleeding, leg pain or swelling, palpitations.   ROS: See pertinent positives and negatives per HPI.  Past Medical History  Diagnosis Date  . Pure hypercholesterolemia 08/16/2014  . Osteoarthritis of knees 01/16/2015  . Bilateral headaches 01/16/2015    -chronic -associated with her menstrual periods   . Anemia     resolved, mild for some time - does give plasma twice per week which is likely etiology    Past Surgical History  Procedure Laterality Date  . Tubal ligation  2010    Family History  Problem Relation Age of Onset  . Arthritis Maternal Grandmother   . Hearing loss Maternal Grandmother   . Arthritis Maternal Grandfather   . Hearing loss Maternal Grandfather     Social History   Social History  . Marital Status: Married    Spouse Name: N/A  . Number of Children: N/A  . Years of Education: N/A   Social History Main Topics  . Smoking status: Never Smoker   . Smokeless tobacco: Never Used  . Alcohol Use: No  . Drug Use: No  . Sexual Activity: Yes    Birth Control/ Protection: None   Other Topics Concern  . None   Social History Narrative   Work or School: works at [employer] Situation: lives with husband, 4 children - none of them live with them      Spiritual Beliefs: Christian      Lifestyle: walks to work daily - 20 minutes; diet is ok - eats a lot [employer]           Current outpatient prescriptions:  .  apixaban  (ELIQUIS) 5 MG TABS tablet, Take 1 tablet (5 mg total) by mouth 2 (two) times daily., Disp: 60 tablet, Rfl: 6  EXAM:  Filed Vitals:   10/16/15 0911  BP: 118/72  Pulse: 61  Temp: 97.3 F (36.3 C)    Body mass index is 29.08 kg/(m^2).  GENERAL: vitals reviewed and listed above, alert, oriented, appears well hydrated and in no acute distress  HEENT: atraumatic, conjunttiva clear, no obvious abnormalities on inspection of external nose and ears  NECK: no obvious masses on inspection  LUNGS: clear to auscultation bilaterally, no wheezes, rales or rhonchi, good air movement  CV: HRRR, no peripheral edema  MS: moves all extremities without noticeable abnormality  PSYCH: pleasant and cooperative, no obvious depression or anxiety  ASSESSMENT AND PLAN:  Discussed the following assessment and plan:  Other acute pulmonary embolism without acute cor pulmonale ([place]) - Plan: Ambulatory referral to Hematology  Primary hypercoagulable state [facility]) - Plan: Ambulatory referral to Hematology  -discussed her labs today  -follow up with hematology for further evaluation and discussion regarding anticoagulation - she was told to follow up with hematology in 1 week but this visit was not set up for her in the hospital -advised office supervisor to meet with her to assist in insurance and financial assistance -Patient advised to return  or notify a doctor immediately if symptoms worsen or persist or new concerns arise.  Patient Instructions  Before you leave: -meet with our office supervisor Estill Bamberg for assistance) -follow up appointment in 3 months  -We placed a referral for you as discussed to the hematologist. We have also provided you with the number to call for this appointment.      Colin Benton R.

## 2015-10-16 NOTE — Progress Notes (Signed)
Pre visit review using our clinic review tool, if applicable. No additional management support is needed unless otherwise documented below in the visit note. 

## 2015-10-16 NOTE — Telephone Encounter (Signed)
New patient appt-s/w patient and gave np appt for 1/05 @ 11:45 w.Dr. Alvy Bimler Referring Dr. Maudie Mercury

## 2015-10-23 ENCOUNTER — Telehealth: Payer: Self-pay | Admitting: Hematology and Oncology

## 2015-10-23 ENCOUNTER — Encounter: Payer: Self-pay | Admitting: Hematology and Oncology

## 2015-10-23 ENCOUNTER — Ambulatory Visit (HOSPITAL_BASED_OUTPATIENT_CLINIC_OR_DEPARTMENT_OTHER): Payer: Self-pay | Admitting: Hematology and Oncology

## 2015-10-23 VITALS — BP 123/67 | HR 57 | Temp 97.6°F | Resp 18 | Wt 166.1 lb

## 2015-10-23 DIAGNOSIS — Z7901 Long term (current) use of anticoagulants: Secondary | ICD-10-CM

## 2015-10-23 DIAGNOSIS — I82401 Acute embolism and thrombosis of unspecified deep veins of right lower extremity: Secondary | ICD-10-CM

## 2015-10-23 DIAGNOSIS — I2699 Other pulmonary embolism without acute cor pulmonale: Secondary | ICD-10-CM

## 2015-10-23 NOTE — Progress Notes (Signed)
Met with new uninsured patient who needed assistance with Medicaid. Gave patient Medicaid application. Asked patient if she needed assistance with medication and she states she was given enough samples to last til she comes back and the type of medication will depend on if she gets Medicaid or not. Advised pt at that time she may apply for the [facility] which will help with her medication cost at our Outpatient Pharmacy. Pt has my card for any other financial questions or concerns.

## 2015-10-23 NOTE — Telephone Encounter (Signed)
Gv pt appt for 01/20/16.

## 2015-10-24 NOTE — Progress Notes (Signed)
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 female is here because of recent diagnosis of blood clot.  She was hospitalized between 09/29/2015 to 10/03/2015 after presentation with pleuritic chest pain and progressive dyspnea. She underwent extensive evaluation and was found to have significant right lower extremity DVT and PE. She underwent anticoagulation therapy and was subsequently discharged on eloquence. Since hospitalization, pleuritic chest pain is improving. She denies difficulties with breathing or hemoptysis. She denies bleeding complication from [facility]. Prior to admission, she denies recent history of trauma, long distance travel, dehydration, recent surgery, smoking or prolonged immobilization. She had no prior history or diagnosis of cancer. Her age appropriate screening programs are up-to-date. She had prior surgeries before and never had perioperative thromboembolic events. The patient had been exposed to birth control pills and never had thrombotic events. The patient had been pregnant before and denies history of peripartum thromboembolic event or history of recurrent miscarriages. There is no family history of blood clots or miscarriages.  MEDICAL HISTORY:  Past Medical History  Diagnosis Date  . Pure hypercholesterolemia 08/16/2014  . Osteoarthritis of knees 01/16/2015  . Bilateral headaches 01/16/2015    -chronic -associated with her menstrual periods   . Anemia     resolved, mild for some time - does give plasma twice per week which is likely etiology    SURGICAL HISTORY: Past Surgical History  Procedure Laterality Date  . Tubal ligation  2010    SOCIAL HISTORY: Social History   Social History  . Marital Status: Married    Spouse Name: N/A   . Number of Children: N/A  . Years of Education: N/A   Occupational History  . Not on file.   Social History Main Topics  . Smoking status: Never Smoker   . Smokeless tobacco: Never Used  . Alcohol Use: No  . Drug Use: No  . Sexual Activity: Yes    Birth Control/ Protection: None   Other Topics Concern  . Not on file   Social History Narrative   Work or School: works at [employer] Situation: lives with husband, 4 children - none of them live with them      Spiritual Beliefs: Christian      Lifestyle: walks to work daily - 20 minutes; diet is ok - eats a lot at [employer]          FAMILY HISTORY: Family History  Problem Relation Age of Onset  . Arthritis Maternal Grandmother   . Hearing loss Maternal Grandmother   . Arthritis Maternal Grandfather   . Hearing loss Maternal Grandfather   . Clotting disorder Neg Hx     ALLERGIES:  has No Known Allergies.  MEDICATIONS:  Current Outpatient Prescriptions  Medication Sig Dispense Refill  . apixaban (ELIQUIS) 5 MG TABS tablet Take 1 tablet (5 mg total) by mouth 2 (two) times daily. 56 tablet 6   No current facility-administered medications for this visit.    REVIEW OF SYSTEMS:   Constitutional: Denies fevers, chills or abnormal night sweats Eyes: Denies blurriness of vision, double vision or watery eyes Ears, nose, mouth, throat, and face: Denies mucositis or sore throat Gastrointestinal:  Denies nausea, heartburn or change in bowel habits Skin: Denies abnormal skin rashes Lymphatics: Denies new lymphadenopathy or  easy bruising Neurological:Denies numbness, tingling or new weaknesses Behavioral/Psych: Mood is stable, no new changes  All other systems were reviewed with the patient and are negative.  PHYSICAL EXAMINATION: ECOG PERFORMANCE STATUS: 1 - Symptomatic but completely ambulatory  Filed Vitals:   10/23/15 1129  BP: 123/67  Pulse: 57  Temp: 97.6 F (36.4 C)  Resp: 18   Filed Weights   10/23/15  1129  Weight: 166 lb 1.6 oz (75.342 kg)    GENERAL:alert, no distress and comfortable SKIN: skin color, texture, turgor are normal, no rashes or significant lesions EYES: normal, conjunctiva are pink and non-injected, sclera clear OROPHARYNX:no exudate, no erythema and lips, buccal mucosa, and tongue normal  NECK: supple, thyroid normal size, non-tender, without nodularity LYMPH:  no palpable lymphadenopathy in the cervical, axillary or inguinal LUNGS: clear to auscultation and percussion with normal breathing effort HEART: regular rate & rhythm and no murmurs and no lower extremity edema ABDOMEN:abdomen soft, non-tender and normal bowel sounds Musculoskeletal:no cyanosis of digits and no clubbing  PSYCH: alert & oriented x 3 with fluent speech NEURO: no focal motor/sensory deficits  LABORATORY DATA:  I have reviewed the data as listed Recent Results (from the past 2160 hour(s))  Basic metabolic panel     Status: Abnormal   Collection Time: 09/29/15 11:18 AM  Result Value Ref Range   Sodium 142 135 - 145 mmol/L   Potassium 2.9 (L) 3.5 - 5.1 mmol/L   Chloride 110 101 - 111 mmol/L   CO2 23 22 - 32 mmol/L   Glucose, Bld 107 (H) 65 - 99 mg/dL   BUN 10 6 - 20 mg/dL   Creatinine, Ser 0.93 0.44 - 1.00 mg/dL   Calcium 8.8 (L) 8.9 - 10.3 mg/dL   GFR calc non Af Amer >60 >60 mL/min   GFR calc Af Amer >60 >60 mL/min    Comment: (NOTE) The eGFR has been calculated using the CKD EPI equation. This calculation has not been validated in all clinical situations. eGFR's persistently <60 mL/min signify possible Chronic Kidney Disease.    Anion gap 9 5 - 15  CBC     Status: None   Collection Time: 09/29/15 11:18 AM  Result Value Ref Range   WBC 8.3 4.0 - 10.5 K/uL   RBC 4.60 3.87 - 5.11 MIL/uL   Hemoglobin 13.2 12.0 - 15.0 g/dL   HCT 39.7 36.0 - 46.0 %   MCV 86.3 78.0 - 100.0 fL   MCH 28.7 26.0 - 34.0 pg   MCHC 33.2 30.0 - 36.0 g/dL   RDW 13.5 11.5 - 15.5 %   Platelets 303 150 - 400  K/uL  I-stat troponin, ED (not at [facility], [facility])     Status: Abnormal   Collection Time: 09/29/15 11:24 AM  Result Value Ref Range   Troponin i, poc 0.15 (HH) 0.00 - 0.08 ng/mL   Comment NOTIFIED PHYSICIAN    Comment 3            Comment: Due to the release kinetics of cTnI, a negative result within the first hours of the onset of symptoms does not rule out myocardial infarction with certainty. If myocardial infarction is still suspected, repeat the test at appropriate intervals.   I-stat troponin, ED     Status: Abnormal   Collection Time: 09/29/15  3:06 PM  Result Value Ref Range   Troponin i, poc 0.14 (HH) 0.00 - 0.08 ng/mL   Comment NOTIFIED PHYSICIAN    Comment 3  Comment: Due to the release kinetics of cTnI, a negative result within the first hours of the onset of symptoms does not rule out myocardial infarction with certainty. If myocardial infarction is still suspected, repeat the test at appropriate intervals.   Urine rapid drug screen (hosp performed)     Status: None   Collection Time: 09/29/15  3:15 PM  Result Value Ref Range   Opiates NONE DETECTED NONE DETECTED   Cocaine NONE DETECTED NONE DETECTED   Benzodiazepines NONE DETECTED NONE DETECTED   Amphetamines NONE DETECTED NONE DETECTED   Tetrahydrocannabinol NONE DETECTED NONE DETECTED   Barbiturates NONE DETECTED NONE DETECTED    Comment:        DRUG SCREEN FOR MEDICAL PURPOSES ONLY.  IF CONFIRMATION IS NEEDED FOR ANY PURPOSE, NOTIFY LAB WITHIN 5 DAYS.        LOWEST DETECTABLE LIMITS FOR URINE DRUG SCREEN Drug Class       Cutoff (ng/mL) Amphetamine      1000 Barbiturate      200 Benzodiazepine   811 Tricyclics       914 Opiates          300 Cocaine          300 THC              50   Urinalysis, Routine w reflex microscopic     Status: Abnormal   Collection Time: 09/29/15  3:15 PM  Result Value Ref Range   Color, Urine AMBER (A) YELLOW    Comment: BIOCHEMICALS MAY BE AFFECTED BY COLOR    APPearance TURBID (A) CLEAR   Specific Gravity, Urine 1.029 1.005 - 1.030   pH 5.5 5.0 - 8.0   Glucose, UA NEGATIVE NEGATIVE mg/dL   Hgb urine dipstick TRACE (A) NEGATIVE   Bilirubin Urine SMALL (A) NEGATIVE   Ketones, ur 15 (A) NEGATIVE mg/dL   Protein, ur NEGATIVE NEGATIVE mg/dL   Nitrite POSITIVE (A) NEGATIVE   Leukocytes, UA NEGATIVE NEGATIVE  Urine microscopic-add on     Status: Abnormal   Collection Time: 09/29/15  3:15 PM  Result Value Ref Range   Squamous Epithelial / LPF 6-30 (A) NONE SEEN   WBC, UA 0-5 0 - 5 WBC/hpf   RBC / HPF 0-5 0 - 5 RBC/hpf   Bacteria, UA MANY (A) NONE SEEN   Casts GRANULAR CAST (A) NEGATIVE   Crystals CA OXALATE CRYSTALS (A) NEGATIVE   Urine-Other AMORPHOUS URATES/PHOSPHATES   POC Urine Pregnancy, ED (do NOT order at [facility])     Status: None   Collection Time: 09/29/15  3:36 PM  Result Value Ref Range   Preg Test, Ur NEGATIVE NEGATIVE    Comment:        THE SENSITIVITY OF THIS METHODOLOGY IS >24 mIU/mL   Troponin I     Status: Abnormal   Collection Time: 09/29/15  7:32 PM  Result Value Ref Range   Troponin I 0.14 (H) <0.031 ng/mL    Comment:        PERSISTENTLY INCREASED TROPONIN VALUES IN THE RANGE OF 0.04-0.49 ng/mL CAN BE SEEN IN:       -UNSTABLE ANGINA       -CONGESTIVE HEART FAILURE       -MYOCARDITIS       -CHEST TRAUMA       -ARRYHTHMIAS       -LATE PRESENTING MYOCARDIAL INFARCTION       -COPD   CLINICAL FOLLOW-UP RECOMMENDED.   Phosphorus  Status: None   Collection Time: 09/29/15  7:32 PM  Result Value Ref Range   Phosphorus 3.1 2.5 - 4.6 mg/dL  Magnesium     Status: None   Collection Time: 09/29/15  7:32 PM  Result Value Ref Range   Magnesium 2.0 1.7 - 2.4 mg/dL  MRSA PCR Screening     Status: None   Collection Time: 09/29/15  9:26 PM  Result Value Ref Range   MRSA by PCR NEGATIVE NEGATIVE    Comment:        The GeneXpert MRSA Assay (FDA approved for NASAL specimens only), is one component of a comprehensive MRSA  colonization surveillance program. It is not intended to diagnose MRSA infection nor to guide or monitor treatment for MRSA infections.   Protein C activity     Status: None   Collection Time: 09/29/15  9:56 PM  Result Value Ref Range   Protein C Activity 131 73 - 180 %    Comment: (NOTE) Elevated protein C activity is of no known clinical significance. Performed At: [facility] [place], [place] [id number] Lindon Romp MD [id number]   Protein C, total     Status: None   Collection Time: 09/29/15  9:56 PM  Result Value Ref Range   Protein C, Total 90 60 - 150 %    Comment: (NOTE) Performed At: [facility] [place], [place] [id number] Lindon Romp MD WH:[id number]   Protein S activity     Status: Abnormal   Collection Time: 09/29/15  9:56 PM  Result Value Ref Range   Protein S Activity 45 (L) 63 - 140 %    Comment: (NOTE) A deficiency of protein S (PS), either congenital or acquired, increases the risk of thromboembolism. PS activity levels may be falsely low in individuals with APCR/Factor V Leiden. Consider performing free protein S antigen in those with APCR/Factor V Leiden before making a diagnosis of protein S deficiency. Acquired PS deficiency is more common than congenital deficiency. PS values decrease with normal pregnancy, and are also dependent on age, sex and hormone status. PS values tend to be lower in a younger age group and lower in women than in men. Levels may be decreased in pre-menopausal women on oral contraceptive agents. Acquired deficiency can occur as a result of vitamin K deficiency or antagonism, severe hepatic disorders, (hepatitis, cirrhosis, etc.), nephrotic syndrome, inflammatory bowel disease, certain chemotherapeutic agents, L-asparaginse therapy, sepsis, disseminated intravascular coagulation (DIC) and acute thrombosis. Levels may be decreased in  patients with polycythemia vera,  sickle cell disease and essential thrombocythemia. Repeat evaluation on a new plasma sample to confirm or refute this result should be considered, after ruling out acquired causes, depending on the clinical scenario. Performed At: [facility] [place], [place] [id number] Lindon Romp MD XB:[id number]   Protein S, total     Status: None   Collection Time: 09/29/15  9:56 PM  Result Value Ref Range   Protein S Ag, Total 92 60 - 150 %    Comment: (NOTE) Performed At: [facility] [place], [place] [id number] Lindon Romp MD [id number]   Beta-2-glycoprotein i abs, IgG/M/A     Status: None   Collection Time: 09/29/15  9:56 PM  Result Value Ref Range   Beta-2 Glyco I IgG <9 0 - 20 GPI IgG units    Comment: (NOTE) The reference interval reflects a 3SD or 99th percentile interval, which is  thought to represent a potentially clinically significant result in accordance with the International Consensus Statement on the classification criteria for definitive antiphospholipid syndrome (APS). J Thromb Haem 2006;4:295-306.    Beta-2-Glycoprotein I IgM <9 0 - 32 GPI IgM units    Comment: (NOTE) The reference interval reflects a 3SD or 99th percentile interval, which is thought to represent a potentially clinically significant result in accordance with the International Consensus Statement on the classification criteria for definitive antiphospholipid syndrome (APS). J Thromb Haem 2006;4:295-306. Performed At: [facility] [place], [place] [id number] Lindon Romp MD [id number]    Beta-2-Glycoprotein I IgA <9 0 - 25 GPI IgA units    Comment: (NOTE) The reference interval reflects a 3SD or 99th percentile interval, which is thought to represent a potentially clinically significant result in accordance with the International Consensus Statement on the classification criteria for definitive  antiphospholipid syndrome (APS). J Thromb Haem 2006;4:295-306.   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 Factor V Leiden is more resistant to inactivation by activated protein C.  As a result, factor V persists in the circulation leading to a mild hypercoagulable state.  Factor V Leiden has been reported in patients with deep vein thrombosis, pulmonary embolus, central retinal vein occulsion, cerebral sinus thrombosis, and hepatic vein thrombosis. The relative risk of venous thrombosis is increased approximately 4-8 fold in individuals who are heterozygous.  About 3- 8% of the general [place] and European population are heterozygous.  The risk of venous thrombosis increases exponentially in patients with more than one risk factor, including:  age, surgery, oral contraceptive use, pregnancy, elevated homocysteine levels, or a Factor II/prothrombin mutation (G20210A).  Additionally, for i ndividuals found to be heterozygous for the Factor V Leiden mutation, presence of a second mutation, Factor V R2, further increases the risk if venous thrombosis. Contact [facility]'s Genetics Customer Service at [phone] for further information on both the Factor II (Prothrombin) DNA Analysis, and Factor V R2 DNA Analysis tests.    Comment Comment     Comment: (NOTE) **Genetic counselors are available for health care providers  to**  discuss results at [phone]). Methodology: DNA analysis of the Factor V gene was performed by allele-specific PCR. The diagnostic sensitivity and specificity is >99% for both. Molecular-based testing is highly accurate, but as in any laboratory test, diagnostic errors may occur. All test results must be combined with clinical information for the most accurate interpretation. References: Voelkerding K (1996).  Clin Lab Med [phone]. Allison Quarry, PhD, [facility] Ruben Reason, PhD, [facility] Jens Som, PhD, [facility] Annetta Maw, M.S., PhD, [facility] Alfredo Bach, PhD, [facility] Norva Riffle, PhD, [facility] Earlean Polka PhD, [facility] Performed At: [facility] [place], [place] [id number] Nechama Guard MD BT:[id number]   Prothrombin gene mutation     Status: None   Collection Time: 09/29/15  9:56 PM  Result Value Ref Range   Recommendations-PTGENE: Comment     Comment: (NOTE) NEGATIVE No mutation identified. Comment: A point mutation (G20210A) in the factor II (prothrombin) gene is the second most common cause of inherited thrombophilia. The incidence of this mutation in the [place] Caucasian population is about 2% and in the [place] American population it is approximately 0.5%. This mutation is rare in the [place] and Native American population. Being heterozygous for a prothrombin mutation increases the risk for developing venous thrombosis about 2 to 3 times above the general population risk. Being homozygous for the prothrombin gene mutation increases the relative risk for venous thrombosis further, although it is not yet known how much further the risk is increased. In women heterozygous for the prothrombin gene mutation, the use of estrogen containing oral contraceptives increases the relative risk of venous thrombosis about 16 times and the risk of developing cerebral thrombosis is also significantly increased. In pregnancy  the pr othrombin gene mutation increases risk for venous thrombosis and may increase risk for stillbirth, placental abruption, pre-eclampsia and fetal growth restriction. If the patient possesses two or more congenital or acquired thrombophilic risk factors, the risk for thrombosis may rise to more than the sum of the risk ratios for the individual mutations. This assay detects only the prothrombin G20210A mutation and does not measure genetic abnormalities elsewhere in the genome. Other thrombotic risk factors may be pursued through systematic clinical laboratory analysis. These factors include the R506Q (Leiden) mutation in the Factor V gene, plasma homocysteine levels, as well as testing for deficiencies of antithrombin III, protein C and protein S.    Additional Information Comment     Comment: (NOTE) Genetic Counselors are available for health care providers to discuss results at [phone]). Methodology: DNA analysis of the Factor II gene was performed by PCR amplification followed by restriction analysis. The diagnostic sensitivity is >99% for both. All the tests must be combined with clinical information for the most accurate interpretation. Molecular-based testing is highly accurate, but as in any laboratory test, diagnostic errors may occur. Poort SR, et al. Blood. 1996; 73:2202-5427. Varga EA. Circulation. 2004; 062:B76-E83. Mervin Hack, et [place]; 19:700-703. Allison Quarry, PhD, [facility] Ruben Reason, PhD, [facility] Jens Som, PhD, [facility] Annetta Maw, M.S., PhD, [facility] Alfredo Bach, PhD, [facility] Norva Riffle, PhD, [facility] Earlean Polka, PhD, [facility] Performed At: [facility] [place], [place] [id number] Nechama Guard MD [id number]:[id number]   Cardiolipin antibodies, IgG, IgM, IgA     Status: None   Collection Time: 09/29/15  9:56 PM  Result Value Ref Range   Anticardiolipin IgG <9 0 - 14 GPL  U/mL    Comment: (NOTE)                          Negative:              <15                          Indeterminate:     15 - 20                          Low-Med Positive: >20 - 80                          High  Positive:         >80    Anticardiolipin IgM <9 0 - 12 MPL U/mL    Comment: (NOTE)                          Negative:              <13                          Indeterminate:     13 - 20                          Low-Med Positive: >20 - 80                          High Positive:         >80    Anticardiolipin IgA <9 0 - 11 APL U/mL    Comment: (NOTE)                          Negative:              <12                          Indeterminate:     12 - 20                          Low-Med Positive: >20 - 80                          High Positive:         >80 Performed At: [facility] [address] [place], [place] [id number] Lindon Romp MD [id number]   Lactic acid, plasma     Status: None   Collection Time: 09/29/15  9:56 PM  Result Value Ref Range   Lactic Acid, Venous 1.3 0.5 - 2.0 mmol/L  Heparin level (unfractionated)     Status: None   Collection Time: 09/30/15 12:37 AM  Result Value Ref Range   Heparin Unfractionated 0.54 0.30 - 0.70 IU/mL    Comment:        IF HEPARIN RESULTS ARE BELOW EXPECTED VALUES, AND PATIENT DOSAGE HAS BEEN CONFIRMED, SUGGEST FOLLOW UP TESTING OF ANTITHROMBIN III LEVELS.   Troponin I     Status: Abnormal   Collection Time: 09/30/15 12:37 AM  Result Value Ref Range   Troponin I 0.11 (H) <0.031 ng/mL    Comment:        PERSISTENTLY INCREASED TROPONIN VALUES IN THE RANGE OF 0.04-0.49 ng/mL CAN BE SEEN IN:       -UNSTABLE ANGINA       -CONGESTIVE HEART FAILURE       -MYOCARDITIS       -CHEST TRAUMA       -ARRYHTHMIAS       -LATE PRESENTING MYOCARDIAL INFARCTION       -COPD   CLINICAL FOLLOW-UP RECOMMENDED.   Comprehensive metabolic panel     Status: Abnormal   Collection Time: 09/30/15 12:37 AM  Result Value Ref  Range   Sodium 142 135 - 145 mmol/L   Potassium 3.7 3.5 - 5.1 mmol/L    Comment: DELTA CHECK NOTED   Chloride 110  101 - 111 mmol/L   CO2 21 (L) 22 - 32 mmol/L   Glucose, Bld 102 (H) 65 - 99 mg/dL   BUN 8 6 - 20 mg/dL   Creatinine, Ser 0.93 0.44 - 1.00 mg/dL   Calcium 8.3 (L) 8.9 - 10.3 mg/dL   Total Protein 5.9 (L) 6.5 - 8.1 g/dL   Albumin 3.2 (L) 3.5 - 5.0 g/dL   AST 20 15 - 41 U/L   ALT 11 (L) 14 - 54 U/L   Alkaline Phosphatase 58 38 - 126 U/L   Total Bilirubin 0.8 0.3 - 1.2 mg/dL   GFR calc non Af Amer >60 >60 mL/min   GFR calc Af Amer >60 >60 mL/min    Comment: (NOTE) The eGFR has been calculated using the CKD EPI equation. This calculation has not been validated in all clinical situations. eGFR's persistently <60 mL/min signify possible Chronic Kidney Disease.    Anion gap 11 5 - 15  CBC WITH DIFFERENTIAL     Status: None   Collection Time: 09/30/15 12:37 AM  Result Value Ref Range   WBC 8.3 4.0 - 10.5 K/uL   RBC 4.40 3.87 - 5.11 MIL/uL   Hemoglobin 12.6 12.0 - 15.0 g/dL   HCT 38.3 36.0 - 46.0 %   MCV 87.0 78.0 - 100.0 fL   MCH 28.6 26.0 - 34.0 pg   MCHC 32.9 30.0 - 36.0 g/dL   RDW 13.3 11.5 - 15.5 %   Platelets 309 150 - 400 K/uL   Neutrophils Relative % 56 %   Neutro Abs 4.7 1.7 - 7.7 K/uL   Lymphocytes Relative 31 %   Lymphs Abs 2.6 0.7 - 4.0 K/uL   Monocytes Relative 6 %   Monocytes Absolute 0.5 0.1 - 1.0 K/uL   Eosinophils Relative 6 %   Eosinophils Absolute 0.5 0.0 - 0.7 K/uL   Basophils Relative 1 %   Basophils Absolute 0.0 0.0 - 0.1 K/uL  Troponin I     Status: Abnormal   Collection Time: 09/30/15  8:32 AM  Result Value Ref Range   Troponin I 0.07 (H) <0.031 ng/mL    Comment:        PERSISTENTLY INCREASED TROPONIN VALUES IN THE RANGE OF 0.04-0.49 ng/mL CAN BE SEEN IN:       -UNSTABLE ANGINA       -CONGESTIVE HEART FAILURE       -MYOCARDITIS       -CHEST TRAUMA       -ARRYHTHMIAS       -LATE PRESENTING MYOCARDIAL INFARCTION       -COPD    CLINICAL FOLLOW-UP RECOMMENDED.   Heparin level (unfractionated)     Status: None   Collection Time: 09/30/15  8:32 AM  Result Value Ref Range   Heparin Unfractionated 0.55 0.30 - 0.70 IU/mL    Comment:        IF HEPARIN RESULTS ARE BELOW EXPECTED VALUES, AND PATIENT DOSAGE HAS BEEN CONFIRMED, SUGGEST FOLLOW UP TESTING OF ANTITHROMBIN III LEVELS.   Comprehensive metabolic panel     Status: Abnormal   Collection Time: 10/01/15  3:08 AM  Result Value Ref Range   Sodium 141 135 - 145 mmol/L   Potassium 3.5 3.5 - 5.1 mmol/L   Chloride 110 101 - 111 mmol/L   CO2 24 22 - 32 mmol/L   Glucose, Bld 107 (H) 65 - 99 mg/dL   BUN 6 6 - 20 mg/dL   Creatinine, Ser 0.90 0.44 - 1.00 mg/dL  Calcium 8.1 (L) 8.9 - 10.3 mg/dL   Total Protein 5.6 (L) 6.5 - 8.1 g/dL   Albumin 3.0 (L) 3.5 - 5.0 g/dL   AST 26 15 - 41 U/L   ALT 12 (L) 14 - 54 U/L   Alkaline Phosphatase 52 38 - 126 U/L   Total Bilirubin 0.6 0.3 - 1.2 mg/dL   GFR calc non Af Amer >60 >60 mL/min   GFR calc Af Amer >60 >60 mL/min    Comment: (NOTE) The eGFR has been calculated using the CKD EPI equation. This calculation has not been validated in all clinical situations. eGFR's persistently <60 mL/min signify possible Chronic Kidney Disease.    Anion gap 7 5 - 15  CBC WITH DIFFERENTIAL     Status: Abnormal   Collection Time: 10/01/15  3:08 AM  Result Value Ref Range   WBC 6.5 4.0 - 10.5 K/uL   RBC 3.73 (L) 3.87 - 5.11 MIL/uL   Hemoglobin 11.0 (L) 12.0 - 15.0 g/dL   HCT 33.0 (L) 36.0 - 46.0 %   MCV 88.5 78.0 - 100.0 fL   MCH 29.5 26.0 - 34.0 pg   MCHC 33.3 30.0 - 36.0 g/dL   RDW 13.5 11.5 - 15.5 %   Platelets 288 150 - 400 K/uL   Neutrophils Relative % 52 %   Neutro Abs 3.4 1.7 - 7.7 K/uL   Lymphocytes Relative 34 %   Lymphs Abs 2.2 0.7 - 4.0 K/uL   Monocytes Relative 5 %   Monocytes Absolute 0.3 0.1 - 1.0 K/uL   Eosinophils Relative 8 %   Eosinophils Absolute 0.5 0.0 - 0.7 K/uL   Basophils Relative 1 %   Basophils  Absolute 0.0 0.0 - 0.1 K/uL  Heparin level (unfractionated)     Status: Abnormal   Collection Time: 10/01/15  3:08 AM  Result Value Ref Range   Heparin Unfractionated 0.81 (H) 0.30 - 0.70 IU/mL    Comment:        IF HEPARIN RESULTS ARE BELOW EXPECTED VALUES, AND PATIENT DOSAGE HAS BEEN CONFIRMED, SUGGEST FOLLOW UP TESTING OF ANTITHROMBIN III LEVELS.   CBC WITH DIFFERENTIAL     Status: Abnormal   Collection Time: 10/02/15  4:56 AM  Result Value Ref Range   WBC 6.2 4.0 - 10.5 K/uL   RBC 3.80 (L) 3.87 - 5.11 MIL/uL   Hemoglobin 10.6 (L) 12.0 - 15.0 g/dL   HCT 33.8 (L) 36.0 - 46.0 %   MCV 88.9 78.0 - 100.0 fL   MCH 27.9 26.0 - 34.0 pg   MCHC 31.4 30.0 - 36.0 g/dL   RDW 13.5 11.5 - 15.5 %   Platelets 311 150 - 400 K/uL   Neutrophils Relative % 65 %   Neutro Abs 4.1 1.7 - 7.7 K/uL   Lymphocytes Relative 22 %   Lymphs Abs 1.4 0.7 - 4.0 K/uL   Monocytes Relative 6 %   Monocytes Absolute 0.4 0.1 - 1.0 K/uL   Eosinophils Relative 6 %   Eosinophils Absolute 0.4 0.0 - 0.7 K/uL   Basophils Relative 1 %   Basophils Absolute 0.0 0.0 - 0.1 K/uL  Comprehensive metabolic panel     Status: Abnormal   Collection Time: 10/02/15  4:56 AM  Result Value Ref Range   Sodium 139 135 - 145 mmol/L   Potassium 3.5 3.5 - 5.1 mmol/L   Chloride 109 101 - 111 mmol/L   CO2 24 22 - 32 mmol/L   Glucose, Bld 90 65 -  99 mg/dL   BUN 7 6 - 20 mg/dL   Creatinine, Ser 0.89 0.44 - 1.00 mg/dL   Calcium 8.1 (L) 8.9 - 10.3 mg/dL   Total Protein 5.1 (L) 6.5 - 8.1 g/dL   Albumin 2.7 (L) 3.5 - 5.0 g/dL   AST 22 15 - 41 U/L   ALT 11 (L) 14 - 54 U/L   Alkaline Phosphatase 43 38 - 126 U/L   Total Bilirubin 0.7 0.3 - 1.2 mg/dL   GFR calc non Af Amer >60 >60 mL/min   GFR calc Af Amer >60 >60 mL/min    Comment: (NOTE) The eGFR has been calculated using the CKD EPI equation. This calculation has not been validated in all clinical situations. eGFR's persistently <60 mL/min signify possible Chronic Kidney Disease.     Anion gap 6 5 - 15  Culture, Urine     Status: None   Collection Time: 10/03/15  2:21 PM  Result Value Ref Range   Specimen Description URINE, CATHETERIZED    Special Requests Normal    Culture NO GROWTH 1 DAY    Report Status 10/04/2015 FINAL    Venous Doppler dated 09/30/15: Findings consistent with acute deep vein and superficial thrombosis involving the right common femoral vein, right profunda femoris vein, right femoral vein, right popliteal vein, right posterial tibial vein, right peroneal vein, left greater saphenous vein, and left small saphenous vein. RADIOGRAPHIC STUDIES: I have personally reviewed the radiological images as listed and agreed with the findings in the report. Dg Chest 2 View  09/29/2015  CLINICAL DATA:  Generalized chest pain, onset Saturday. EXAM: CHEST  2 VIEW COMPARISON:  None. FINDINGS: The heart size and mediastinal contours are within normal limits. Both lungs are clear. The visualized skeletal structures are unremarkable. IMPRESSION: No active cardiopulmonary disease. Electronically Signed   By: Rolm Baptise M.D.   On: 09/29/2015 11:37   Ct Angio Chest Pe W/cm &/or Wo Cm  09/29/2015  CLINICAL DATA:  35 year old with generalized chest pain. Pain increases with activity and causes shortness of breath. EXAM: CT ANGIOGRAPHY CHEST WITH CONTRAST TECHNIQUE: Multidetector CT imaging of the chest was performed using the standard protocol during bolus administration of intravenous contrast. Multiplanar CT image reconstructions and MIPs were obtained to evaluate the vascular anatomy. CONTRAST:  181m OMNIPAQUE IOHEXOL 350 MG/ML SOLN COMPARISON:  Chest radiograph 09/29/2015 FINDINGS: Mediastinum/Lymph Nodes: Positive for bilateral pulmonary emboli. Large clot burden in the proximal left lower lobar pulmonary artery. There is also clot extending into the left upper lobe segmental branches. Clot extending into some branches of the right upper lobe. Thrombus extending into the  right middle lobe and right lower lobe branches. Overall, the clot burden is moderate to large. RV/LV ratio is approximately 1.0. No significant deviation of the intrerventricular septum. Mid ascending thoracic aorta measures 3.3 cm. Main pulmonary artery measuring 3.4 cm. Great vessels are patent. No significant chest lymphadenopathy. Small amount of pericardial fluid. Lungs/Pleura: No pleural effusions. Trachea and mainstem bronchi are patent. Few scattered ground-glass densities in the lungs but no significant consolidation or airspace disease. Most prominent ground-glass densities are in the left upper lobe. Upper abdomen: Images of the upper abdomen are unremarkable. Musculoskeletal: No acute bone abnormality. Review of the MIP images confirms the above findings. IMPRESSION: Positive for acute PE with CT evidence of right heart strain (RV/LV Ratio = 1.0) consistent with at least submassive (intermediate risk) PE. The presence of right heart strain has been associated with an increased risk of  morbidity and mortality. Please activate Code PE by paging [phone]. Critical Value/emergent results were called by telephone at the time of interpretation on 09/29/2015 at 6:07 pm to Dr. Rex Kras , who verbally acknowledged these results. Electronically Signed   By: Markus Daft M.D.   On: 09/29/2015 18:17    ASSESSMENT:  DVT & PE  PLAN:  Pulmonary embolism ([place])  I reviewed with the patient about the plan for care for DVT/PE.  This last episode of blood clot appeared to be unprovoked.  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 Protein S level was a little low but this would be considered inaccurate in the setting of an acute clot.  We discussed about various options of anticoagulation therapies including warfarin, low molecular weight heparin such as Lovenox or newer agents such as Rivaroxaban. Some of the risks and benefits discussed including costs  involved, the need for monitoring, risks of life-threatening bleeding/hospitalization, reversibility of each agent in the event of bleeding or overdose, safety profile of each drug and taking into account other social issues such as ease of administration of medications, etc. Ultimately, we have made an informed decision for the patient to continue her treatment with Eliquis. She has enough samples to last her approximately 3 months until she can get medical insurance. Minimum duration of anticoagulation therapy would be 12 months.  Another main issue we discussed today included the role of screening other family members for thrombophilia disorder. At present time, I would not recommend testing the patient's family members as it would not benefit them.  Thrombophilia disorder is a genetic predisposition which increases an individual's risk for a thrombotic event, NOT a disease.  We discussed the implications of genetic screening including the possibility of uninsurability, costs involved, emotional distress and possible discrimination at various levels for the affected individual.  Rather than genetic screening, one can possibly benefit from genetic counseling or dissemination of appropriate reading materials to educate other family members.  Finally, at the end of our consultation today, I reinforced the importance of preventive strategies such as avoiding hormonal supplement, avoiding cigarette smoking, keeping up-to-date with screening programs for early cancer detection, frequent ambulation for long distance travel and aggressive DVT prophylaxis in all surgical settings.  I plan to see her back in 3 months to assess for side effects. If she is not able to get good insurance, I might have to switch her treatment to warfarin.       Orders Placed This Encounter  Procedures  . CBC with Differential/Platelet    Standing Status: Future     Number of Occurrences:      Standing Expiration Date:  11/26/2016  . Comprehensive metabolic panel    Standing Status: Future     Number of Occurrences:      Standing Expiration Date: 11/26/2016    All questions were answered. The patient knows to call the clinic with any problems, questions or concerns. I spent 55 minutes counseling the patient face to face. The total time spent in the appointment was 60 minutes and more than 50% was on counseling.     [facility], [place], MD 10/24/2015 3:58 PM

## 2015-10-24 NOTE — Assessment & Plan Note (Signed)
  I reviewed with the patient about the plan for care for DVT/PE.  This last episode of blood clot appeared to be unprovoked.  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 Protein S level was a little low but this would be considered inaccurate in the setting of an acute clot.  We discussed about various options of anticoagulation therapies including warfarin, low molecular weight heparin such as Lovenox or newer agents such as Rivaroxaban. Some of the risks and benefits discussed including costs involved, the need for monitoring, risks of life-threatening bleeding/hospitalization, reversibility of each agent in the event of bleeding or overdose, safety profile of each drug and taking into account other social issues such as ease of administration of medications, etc. Ultimately, we have made an informed decision for the patient to continue her treatment with Eliquis. She has enough samples to last her approximately 3 months until she can get medical insurance. Minimum duration of anticoagulation therapy would be 12 months.  Another main issue we discussed today included the role of screening other family members for thrombophilia disorder. At present time, I would not recommend testing the patient's family members as it would not benefit them.  Thrombophilia disorder is a genetic predisposition which increases an individual's risk for a thrombotic event, NOT a disease.  We discussed the implications of genetic screening including the possibility of uninsurability, costs involved, emotional distress and possible discrimination at various levels for the affected individual.  Rather than genetic screening, one can possibly benefit from genetic counseling or dissemination of appropriate reading materials to educate other family members.  Finally, at the end of our consultation today, I reinforced the importance of preventive strategies such as avoiding  hormonal supplement, avoiding cigarette smoking, keeping up-to-date with screening programs for early cancer detection, frequent ambulation for long distance travel and aggressive DVT prophylaxis in all surgical settings.  I plan to see her back in 3 months to assess for side effects. If she is not able to get good insurance, I might have to switch her treatment to warfarin.

## 2015-12-02 ENCOUNTER — Emergency Department (HOSPITAL_COMMUNITY): Payer: Self-pay

## 2015-12-02 ENCOUNTER — Emergency Department (HOSPITAL_COMMUNITY)
Admission: EM | Admit: 2015-12-02 | Discharge: 2015-12-02 | Disposition: A | Discharge status: Home / Self Care | Payer: Self-pay | Attending: Emergency Medicine | Admitting: Emergency Medicine

## 2015-12-02 ENCOUNTER — Encounter (HOSPITAL_COMMUNITY): Payer: Self-pay | Admitting: Emergency Medicine

## 2015-12-02 DIAGNOSIS — R0602 Shortness of breath: Secondary | ICD-10-CM | POA: Insufficient documentation

## 2015-12-02 DIAGNOSIS — R079 Chest pain, unspecified: Secondary | ICD-10-CM | POA: Insufficient documentation

## 2015-12-02 DIAGNOSIS — R110 Nausea: Secondary | ICD-10-CM | POA: Insufficient documentation

## 2015-12-02 DIAGNOSIS — R42 Dizziness and giddiness: Secondary | ICD-10-CM | POA: Insufficient documentation

## 2015-12-02 HISTORY — DX: Phlebitis and thrombophlebitis of unspecified site: I80.9

## 2015-12-02 LAB — BASIC METABOLIC PANEL
Anion gap: 10 (ref 5–15)
BUN: 10 mg/dL (ref 6–20)
CHLORIDE: 110 mmol/L (ref 101–111)
CO2: 23 mmol/L (ref 22–32)
CREATININE: 0.88 mg/dL (ref 0.44–1.00)
Calcium: 9.5 mg/dL (ref 8.9–10.3)
GFR calc Af Amer: >60 mL/min (ref >60)
GFR calc non Af Amer: >60 mL/min (ref >60)
GLUCOSE: 111 mg/dL — AB (ref 65–99)
POTASSIUM: 3.6 mmol/L (ref 3.5–5.1)
SODIUM: 143 mmol/L (ref 135–145)

## 2015-12-02 LAB — CBC
HEMATOCRIT: 35.7 % — AB (ref 36.0–46.0)
Hemoglobin: 11.6 g/dL — ABNORMAL LOW (ref 12.0–15.0)
MCH: 28.7 pg (ref 26.0–34.0)
MCHC: 32.5 g/dL (ref 30.0–36.0)
MCV: 88.4 fL (ref 78.0–100.0)
PLATELETS: 449 10*3/uL — AB (ref 150–400)
RBC: 4.04 MIL/uL (ref 3.87–5.11)
RDW: 13.7 % (ref 11.5–15.5)
WBC: 5.9 10*3/uL (ref 4.0–10.5)

## 2015-12-02 LAB — I-STAT TROPONIN, ED: Troponin i, poc: 0 ng/mL (ref 0.00–0.08)

## 2015-12-02 NOTE — ED Notes (Signed)
Mild chest pain starting Friday, worsening yesterday into today. Presents with central chest squeezing pain that radiates into back, mild SOB, nausea, and dizziness. Hx of blood clots, is on Eliquis BID, has not missed any doses recently. She says this does feel like the last time she had a blood clot

## 2015-12-16 ENCOUNTER — Emergency Department (HOSPITAL_COMMUNITY)
Admission: EM | Admit: 2015-12-16 | Discharge: 2015-12-16 | Disposition: A | Discharge status: Home / Self Care | Payer: Self-pay | Attending: Emergency Medicine | Admitting: Emergency Medicine

## 2015-12-16 ENCOUNTER — Encounter (HOSPITAL_COMMUNITY): Payer: Self-pay | Admitting: Emergency Medicine

## 2015-12-16 ENCOUNTER — Emergency Department (HOSPITAL_COMMUNITY): Payer: Self-pay

## 2015-12-16 DIAGNOSIS — R0602 Shortness of breath: Secondary | ICD-10-CM | POA: Insufficient documentation

## 2015-12-16 DIAGNOSIS — R0789 Other chest pain: Secondary | ICD-10-CM | POA: Insufficient documentation

## 2015-12-16 LAB — BASIC METABOLIC PANEL
Anion gap: 8 (ref 5–15)
BUN: 13 mg/dL (ref 6–20)
CALCIUM: 9 mg/dL (ref 8.9–10.3)
CO2: 24 mmol/L (ref 22–32)
CREATININE: 0.77 mg/dL (ref 0.44–1.00)
Chloride: 105 mmol/L (ref 101–111)
GFR calc non Af Amer: >60 mL/min (ref >60)
GLUCOSE: 99 mg/dL (ref 65–99)
Potassium: 3.7 mmol/L (ref 3.5–5.1)
Sodium: 137 mmol/L (ref 135–145)

## 2015-12-16 LAB — CBC
HCT: 37.1 % (ref 36.0–46.0)
Hemoglobin: 12.4 g/dL (ref 12.0–15.0)
MCH: 28.8 pg (ref 26.0–34.0)
MCHC: 33.4 g/dL (ref 30.0–36.0)
MCV: 86.1 fL (ref 78.0–100.0)
PLATELETS: 439 10*3/uL — AB (ref 150–400)
RBC: 4.31 MIL/uL (ref 3.87–5.11)
RDW: 13.5 % (ref 11.5–15.5)
WBC: 7.7 10*3/uL (ref 4.0–10.5)

## 2015-12-16 LAB — I-STAT TROPONIN, ED: TROPONIN I, POC: 0 ng/mL (ref 0.00–0.08)

## 2015-12-16 NOTE — ED Notes (Signed)
Patient presents for centralized CP described as tightness with intermittent SOB. Denies N/V/D, diaphoresis.

## 2015-12-16 NOTE — ED Notes (Signed)
Pt called. No answer

## 2015-12-17 ENCOUNTER — Encounter (HOSPITAL_COMMUNITY): Payer: Self-pay | Admitting: *Deleted

## 2015-12-17 ENCOUNTER — Emergency Department (HOSPITAL_COMMUNITY)
Admission: EM | Admit: 2015-12-17 | Discharge: 2015-12-17 | Disposition: A | Discharge status: Home / Self Care | Payer: Self-pay | Attending: Physician Assistant | Admitting: Physician Assistant

## 2015-12-17 ENCOUNTER — Emergency Department (HOSPITAL_BASED_OUTPATIENT_CLINIC_OR_DEPARTMENT_OTHER): Payer: Self-pay

## 2015-12-17 ENCOUNTER — Emergency Department (HOSPITAL_COMMUNITY): Payer: Self-pay

## 2015-12-17 DIAGNOSIS — Z79899 Other long term (current) drug therapy: Secondary | ICD-10-CM | POA: Insufficient documentation

## 2015-12-17 DIAGNOSIS — Z862 Personal history of diseases of the blood and blood-forming organs and certain disorders involving the immune mechanism: Secondary | ICD-10-CM | POA: Insufficient documentation

## 2015-12-17 DIAGNOSIS — M79609 Pain in unspecified limb: Secondary | ICD-10-CM

## 2015-12-17 DIAGNOSIS — Z8639 Personal history of other endocrine, nutritional and metabolic disease: Secondary | ICD-10-CM | POA: Insufficient documentation

## 2015-12-17 DIAGNOSIS — R42 Dizziness and giddiness: Secondary | ICD-10-CM | POA: Insufficient documentation

## 2015-12-17 DIAGNOSIS — R079 Chest pain, unspecified: Secondary | ICD-10-CM

## 2015-12-17 DIAGNOSIS — R0602 Shortness of breath: Secondary | ICD-10-CM

## 2015-12-17 DIAGNOSIS — Z3202 Encounter for pregnancy test, result negative: Secondary | ICD-10-CM | POA: Insufficient documentation

## 2015-12-17 LAB — BASIC METABOLIC PANEL
ANION GAP: 9 (ref 5–15)
BUN: 9 mg/dL (ref 6–20)
CHLORIDE: 107 mmol/L (ref 101–111)
CO2: 24 mmol/L (ref 22–32)
Calcium: 9 mg/dL (ref 8.9–10.3)
Creatinine, Ser: 0.83 mg/dL (ref 0.44–1.00)
GFR calc non Af Amer: >60 mL/min (ref >60)
Glucose, Bld: 105 mg/dL — ABNORMAL HIGH (ref 65–99)
POTASSIUM: 3.7 mmol/L (ref 3.5–5.1)
SODIUM: 140 mmol/L (ref 135–145)

## 2015-12-17 LAB — CBC WITH DIFFERENTIAL/PLATELET
BASOS PCT: 1 %
Basophils Absolute: 0 10*3/uL (ref 0.0–0.1)
EOS ABS: 0.1 10*3/uL (ref 0.0–0.7)
Eosinophils Relative: 1 %
HCT: 37.5 % (ref 36.0–46.0)
HEMOGLOBIN: 12.1 g/dL (ref 12.0–15.0)
LYMPHS ABS: 1.3 10*3/uL (ref 0.7–4.0)
Lymphocytes Relative: 21 %
MCH: 27.7 pg (ref 26.0–34.0)
MCHC: 32.3 g/dL (ref 30.0–36.0)
MCV: 85.8 fL (ref 78.0–100.0)
Monocytes Absolute: 0.4 10*3/uL (ref 0.1–1.0)
Monocytes Relative: 7 %
NEUTROS PCT: 70 %
Neutro Abs: 4.5 10*3/uL (ref 1.7–7.7)
Platelets: 405 10*3/uL — ABNORMAL HIGH (ref 150–400)
RBC: 4.37 MIL/uL (ref 3.87–5.11)
RDW: 13.5 % (ref 11.5–15.5)
WBC: 6.3 10*3/uL (ref 4.0–10.5)

## 2015-12-17 LAB — I-STAT TROPONIN, ED
TROPONIN I, POC: 0 ng/mL (ref 0.00–0.08)
Troponin i, poc: 0 ng/mL (ref 0.00–0.08)

## 2015-12-17 LAB — I-STAT BETA HCG BLOOD, ED (MC, WL, AP ONLY): I-stat hCG, quantitative: <5 m[IU]/mL (ref <5)

## 2015-12-17 MED ORDER — ONDANSETRON HCL 4 MG/2ML IJ SOLN
4.0000 mg | INTRAMUSCULAR | Status: AC
  Administered 2015-12-17: 4 mg via INTRAVENOUS
  Filled 2015-12-17: qty 2

## 2015-12-17 MED ORDER — IOHEXOL 350 MG/ML SOLN
80.0000 mL | Freq: Once | INTRAVENOUS | Status: AC | PRN
  Administered 2015-12-17: 80 mL via INTRAVENOUS

## 2015-12-17 MED ORDER — OXYCODONE-ACETAMINOPHEN 5-325 MG PO TABS: 1.0000 | ORAL_TABLET | ORAL | Status: DC | PRN

## 2015-12-17 MED ORDER — ONDANSETRON 4 MG PO TBDP: 4.0000 mg | ORAL_TABLET | Freq: Three times a day (TID) | ORAL | Status: DC | PRN

## 2015-12-17 MED ORDER — MORPHINE SULFATE (PF) 4 MG/ML IV SOLN
4.0000 mg | Freq: Once | INTRAVENOUS | Status: AC
  Administered 2015-12-17: 4 mg via INTRAVENOUS
  Filled 2015-12-17: qty 1

## 2015-12-17 NOTE — Progress Notes (Signed)
VASCULAR LAB PRELIMINARY  PRELIMINARY  PRELIMINARY  PRELIMINARY  Right lower extremity venous duplex completed.    Preliminary report:  No evidence of acute DVT superficial thrombosis, or Baler's cyst of the right lower extremity. 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 There appears to be no propagation to the left common femoral vein.  Mayzie Caughlin, RVS 12/17/2015, 1:15 PM

## 2015-12-17 NOTE — ED Notes (Addendum)
Pt reports intermittent chest pains for days and onset of sob last night. Denies recent cough. Airway intact. Pt was seen at [facility] last night for same but left after triage.

## 2015-12-17 NOTE — ED Notes (Signed)
Patient states her pain has now traveled to her R lower abdomen.   Patient states "i think my pain is moving".

## 2015-12-17 NOTE — ED Provider Notes (Signed)
CSN: [id number]     Arrival date & time 12/17/15  0818 History   First MD Initiated Contact with Patient 12/17/15 [phone]     Chief Complaint  Patient presents with  . Chest Pain  . Shortness of Breath    HPI   Angelette Rothwell is a 35 y.o. female with a PMH of HLD, PE on eliquis who presents to the ED with chest pain and shortness of breath. She notes her symptoms have been intermittent for the past 2 weeks, though became constant yesterday. She denies exacerbating factors. She has tried over-the-counter pain medication with no significant symptom relief. She states her symptoms feel consistent with how she felt when she was diagnosed with PE. She denies recent travel or immobility, recent surgery, history of malignancy, estrogen use. She notes her right lower extremity has been "cramping" intermittently over the past 2 weeks. She states she has been taking her eliquis as directed and has not missed any doses. She notes associated nausea. She denies abdominal pain, vomiting, diarrhea, constipation, dysuria, urgency, frequency.   Past Medical History  Diagnosis Date  . Pure hypercholesterolemia 08/16/2014  . Osteoarthritis of knees 01/16/2015  . Bilateral headaches 01/16/2015    -chronic -associated with her menstrual periods   . Anemia     resolved, mild for some time - does give plasma twice per week which is likely etiology  . Blood clot associated with vein wall inflammation    Past Surgical History  Procedure Laterality Date  . Tubal ligation  2010   Family History  Problem Relation Age of Onset  . Arthritis Maternal Grandmother   . Hearing loss Maternal Grandmother   . Arthritis Maternal Grandfather   . Hearing loss Maternal Grandfather   . Clotting disorder Neg Hx    Social History  Substance Use Topics  . Smoking status: Never Smoker   . Smokeless tobacco: Never Used  . Alcohol Use: No   OB History    No data available       Review of Systems  Constitutional: Positive  for chills. Negative for fever.  HENT: Negative for congestion.   Respiratory: Positive for shortness of breath. Negative for cough.   Cardiovascular: Positive for chest pain. Negative for leg swelling.  Neurological: Positive for dizziness.  All other systems reviewed and are negative.     Allergies  Review of patient's allergies indicates no known allergies.  Home Medications   Prior to Admission medications   Medication Sig Start Date End Date Taking? Authorizing Provider  apixaban (ELIQUIS) 5 MG TABS tablet Take 1 tablet (5 mg total) by mouth 2 (two) times daily. 10/16/15  Yes [name], DO  ibuprofen (ADVIL,MOTRIN) 200 MG tablet Take 400 mg by mouth every 6 (six) hours as needed for headache or mild pain.   Yes Historical Provider, MD  ondansetron (ZOFRAN ODT) 4 MG disintegrating tablet Take 1 tablet (4 mg total) by mouth every 8 (eight) hours as needed for nausea. 12/17/15   Marella Chimes, PA-C  oxyCODONE-acetaminophen (PERCOCET/ROXICET) 5-325 MG tablet Take 1-2 tablets by mouth every 4 (four) hours as needed for severe pain. 12/17/15   Marella Chimes, PA-C    BP 124/69 mmHg  Pulse 56  Temp(Src) 98.2 F (36.8 C) (Oral)  Resp 16  SpO2 96%  LMP 12/02/2015 Physical Exam  Constitutional: She is oriented to person, place, and time. She appears well-developed and well-nourished. No distress.  HENT:  Head: Normocephalic and atraumatic.  Right Ear:  External ear normal.  Left Ear: External ear normal.  Nose: Nose normal.  Mouth/Throat: Uvula is midline, oropharynx is clear and moist and mucous membranes are normal.  Eyes: Conjunctivae, EOM and lids are normal. Pupils are equal, round, and reactive to light. Right eye exhibits no discharge. Left eye exhibits no discharge. No scleral icterus.  Neck: Normal range of motion. Neck supple.  Cardiovascular: Normal rate, regular rhythm, normal heart sounds, intact distal pulses and normal pulses.   Pulmonary/Chest: Effort  normal and breath sounds normal. No respiratory distress. She has no wheezes. She has no rales.  Abdominal: Soft. Normal appearance and bowel sounds are normal. She exhibits no distension and no mass. There is no tenderness. There is no rigidity, no rebound and no guarding.  Musculoskeletal: Normal range of motion. She exhibits tenderness. She exhibits no edema.  Mild TTP to right posterior calf.  Neurological: She is alert and oriented to person, place, and time. She has normal strength. No sensory deficit.  Skin: Skin is warm, dry and intact. No rash noted. She is not diaphoretic. No erythema. No pallor.  Psychiatric: She has a normal mood and affect. Her speech is normal and behavior is normal.  Nursing note and vitals reviewed.   ED Course  Procedures (including critical care time)  Labs Review Labs Reviewed  CBC WITH DIFFERENTIAL/PLATELET - Abnormal; Notable for the following:    Platelets 405 (*)    All other components within normal limits  BASIC METABOLIC PANEL - Abnormal; Notable for the following:    Glucose, Bld 105 (*)    All other components within normal limits  I-STAT TROPOININ, ED  I-STAT BETA HCG BLOOD, ED ([facility], [facility], [facility] ONLY)  I-STAT TROPOININ, ED    Imaging Review Dg Chest 2 View  12/16/2015  CLINICAL DATA:  Chest pain and tightness, shortness of breath acutely EXAM: CHEST  2 VIEW COMPARISON:  12/02/2015 FINDINGS: The heart size and mediastinal contours are within normal limits. Both lungs are clear. The visualized skeletal structures are unremarkable. IMPRESSION: No active cardiopulmonary disease. Electronically Signed   By: Jerilynn Mages.  Shick M.D.   On: 12/16/2015 17:30   Ct Angio Chest Pe W/cm &/or Wo Cm  12/17/2015  CLINICAL DATA:  Intermittent chest pain and shortness breath for 2 weeks. Pain in the center chest and left upper chest. History of pulmonary embolus. EXAM: CT ANGIOGRAPHY CHEST WITH CONTRAST TECHNIQUE: Multidetector CT imaging of the chest was performed using the  standard protocol during bolus administration of intravenous contrast. Multiplanar CT image reconstructions and MIPs were obtained to evaluate the vascular anatomy. CONTRAST:  1mL OMNIPAQUE IOHEXOL 350 MG/ML SOLN COMPARISON:  09/29/2015 FINDINGS: Mediastinum/Nodes: Again noted are bilateral nonocclusive filling defects in the lower lobe pulmonary arteries in the same area of large clot burden seen on prior study. This likely relates to chronic pulmonary emboli. Some of the filling defects are web-like. No new pulmonary emboli. Decreasing clot burden since prior study. No filling defects seen in the upper lobe pulmonary arteries any longer. Heart is enlarged. Aorta is normal caliber. No mediastinal, hilar, or axillary adenopathy. Lungs/Pleura: Lungs are clear. No focal airspace opacities or suspicious nodules. No effusions. Upper abdomen: Imaging into the upper abdomen shows no acute findings. Musculoskeletal: Chest wall soft tissues are unremarkable. No acute bony abnormality or focal bone lesion. Review of the MIP images confirms the above findings. IMPRESSION: Chronic emboli a within the lower lobe pulmonary arteries bilaterally with web-like appearance. Decreasing clot burden since prior study. No  new/acute pulmonary emboli. Mild cardiomegaly. Electronically Signed   By: Rolm Baptise M.D.   On: 12/17/2015 11:12   I have personally reviewed and evaluated these images and lab results as part of my medical decision-making.   EKG Interpretation   Date/Time:  Wednesday December 17 2015 08:27:12 EST Ventricular Rate:  64 PR Interval:  198 QRS Duration: 108 QT Interval:  404 QTC Calculation: 417 R Axis:   68 Text Interpretation:  Sinus rhythm RSR' in V1 or V2, probably normal  variant Nonspecific T abnormalities, anterior leads T wave inversions  V1-V3 Normal sinus rhythm Confirmed by MACKUEN, COURTNEY ([id number]) on  12/17/2015 8:32:19 AM      MDM   Final diagnoses:  Chest pain, unspecified chest pain  type  Shortness of breath    35 year old female presents with chest pain and shortness of breath. States her symptoms have been intermittent for the past 2 weeks and became constant yesterday. Has a history of PE and reports her symptoms feel similar. 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   Patient is afebrile. Vital signs stable. No tachycardia or hypoxia. Heart regular rate and rhythm. Lungs clear to auscultation bilaterally. Abdomen soft, nontender, nondistended. Mild TTP to right posterior calf. Distal pulses intact. Strength and sensation intact.  EKG sinus rhythm, nonspecific T-wave abnormalities in V1 to V3. Troponin negative. CBC negative for leukocytosis or anemia. BMP unremarkable. HCG negative. Patient given pain medication and anti-emetic.   Will obtain CT PE and RLE doppler. CT remarkable for chronic emboli, no new/acute pulmonary emboli. Right lower extremity ultrasound remarkable for chronic DVT. Repeat troponin negative.  Discussed findings with the patient. She is nontoxic and well-appearing, feel she is stable for discharge at this time. Low suspicion for ACS or PE. Patient to follow up with PCP for further evaluation and management.  Strict return precautions discussed. Will give short course of pain medication and zofran for nausea. Patient verbalizes her understanding and is in agreement with plan.  BP 124/69 mmHg  Pulse 56  Temp(Src) 98.2 F (36.8 C) (Oral)  Resp 16  SpO2 96%  LMP 12/02/2015     Marella Chimes, PA-C 12/17/15 1531  Courteney Julio Alm, MD 12/17/15 1556

## 2015-12-17 NOTE — ED Notes (Signed)
Spoke with CT - advised creatinine was back and WNL.

## 2015-12-17 NOTE — Discharge Instructions (Signed)
1. Medications: percocet for pain, zofran for nausea, usual home medications 2. Treatment: rest, drink plenty of fluids 3. Follow Up: please followup with your primary doctor for discussion of your diagnoses and further evaluation after today's visit; if you do not have a primary care doctor use the resource guide provided to find one; please return to the ER for new or worsening symptoms   Nonspecific Chest Pain  Chest pain can be caused by many different conditions. There is always a chance that your pain could be related to something serious, such as a heart attack or a blood clot in your lungs. Chest pain can also be caused by conditions that are not life-threatening. If you have chest pain, it is very important to follow up with your health care provider. CAUSES  Chest pain can be caused by:  Heartburn.  Pneumonia or bronchitis.  Anxiety or stress.  Inflammation around your heart (pericarditis) or lung (pleuritis or pleurisy).  A blood clot in your lung.  A collapsed lung (pneumothorax). It can develop suddenly on its own (spontaneous pneumothorax) or from trauma to the chest.  Shingles infection (varicella-zoster virus).  Heart attack.  Damage to the bones, muscles, and cartilage that make up your chest wall. This can include:  Bruised bones due to injury.  Strained muscles or cartilage due to frequent or repeated coughing or overwork.  Fracture to one or more ribs.  Sore cartilage due to inflammation (costochondritis). RISK FACTORS  Risk factors for chest pain may include:  Activities that increase your risk for trauma or injury to your chest.  Respiratory infections or conditions that cause frequent coughing.  Medical conditions or overeating that can cause heartburn.  Heart disease or family history of heart disease.  Conditions or health behaviors that increase your risk of developing a blood clot.  Having had chicken pox (varicella zoster). SIGNS AND  SYMPTOMS Chest pain can feel like:  Burning or tingling on the surface of your chest or deep in your chest.  Crushing, pressure, aching, or squeezing pain.  Dull or sharp pain that is worse when you move, cough, or take a deep breath.  Pain that is also felt in your back, neck, shoulder, or arm, or pain that spreads to any of these areas. Your chest pain may come and go, or it may stay constant. DIAGNOSIS Lab tests or other studies may be needed to find the cause of your pain. Your health care provider may have you take a test called an ambulatory ECG (electrocardiogram). An ECG records your heartbeat patterns at the time the test is performed. You may also have other tests, such as:  Transthoracic echocardiogram (TTE). During echocardiography, sound waves are used to create a picture of all of the heart structures and to look at how blood flows through your heart.  Transesophageal echocardiogram (TEE).This is a more advanced imaging test that obtains images from inside your body. It allows your health care provider to see your heart in finer detail.  Cardiac monitoring. This allows your health care provider to monitor your heart rate and rhythm in real time.  Holter monitor. This is a portable device that records your heartbeat and can help to diagnose abnormal heartbeats. It allows your health care provider to track your heart activity for several days, if needed.  Stress tests. These can be done through exercise or by taking medicine that makes your heart beat more quickly.  Blood tests.  Imaging tests. TREATMENT  Your treatment depends on  what is causing your chest pain. Treatment may include:  Medicines. These may include:  Acid blockers for heartburn.  Anti-inflammatory medicine.  Pain medicine for inflammatory conditions.  Antibiotic medicine, if an infection is present.  Medicines to dissolve blood clots.  Medicines to treat coronary artery disease.  Supportive  care for conditions that do not require medicines. This may include:  Resting.  Applying heat or cold packs to injured areas.  Limiting activities until pain decreases. HOME CARE INSTRUCTIONS  If you were prescribed an antibiotic medicine, finish it all even if you start to feel better.  Avoid any activities that bring on chest pain.  Do not use any tobacco products, including cigarettes, chewing tobacco, or electronic cigarettes. If you need help quitting, ask your health care provider.  Do not drink alcohol.  Take medicines only as directed by your health care provider.  Keep all follow-up visits as directed by your health care provider. This is important. This includes any further testing if your chest pain does not go away.  If heartburn is the cause for your chest pain, you may be told to keep your head raised (elevated) while sleeping. This reduces the chance that acid will go from your stomach into your esophagus.  Make lifestyle changes as directed by your health care provider. These may include:  Getting regular exercise. Ask your health care provider to suggest some activities that are safe for you.  Eating a heart-healthy diet. A registered dietitian can help you to learn healthy eating options.  Maintaining a healthy weight.  Managing diabetes, if necessary.  Reducing stress. SEEK MEDICAL CARE IF:  Your chest pain does not go away after treatment.  You have a rash with blisters on your chest.  You have a fever. SEEK IMMEDIATE MEDICAL CARE IF:   Your chest pain is worse.  You have an increasing cough, or you cough up blood.  You have severe abdominal pain.  You have severe weakness.  You faint.  You have chills.  You have sudden, unexplained chest discomfort.  You have sudden, unexplained discomfort in your arms, back, neck, or jaw.  You have shortness of breath at any time.  You suddenly start to sweat, or your skin gets clammy.  You feel  nauseous or you vomit.  You suddenly feel light-headed or dizzy.  Your heart begins to beat quickly, or it feels like it is skipping beats. These symptoms may represent a serious problem that is an emergency. Do not wait to see if the symptoms will go away. Get medical help right away. Call your local emergency services (911 in the U.S.). Do not drive yourself to the hospital.   This information is not intended to replace advice given to you by your health care provider. Make sure you discuss any questions you have with your health care provider.   Document Released: 07/14/2005 Document Revised: 10/25/2014 Document Reviewed: 05/10/2014 Elsevier Interactive Patient Education 2016 [employer].   Emergency Department Resource Guide 1) Find a Doctor and Pay Out of Pocket Although you won't have to find out who is covered by your insurance plan, it is a good idea to ask around and get recommendations. You will then need to call the office and see if the doctor you have chosen will accept you as a new patient and what types of options they offer for patients who are self-pay. Some doctors offer discounts or will set up payment plans for their patients who do not have insurance,  but you will need to ask so you aren't surprised when you get to your appointment.  2) Contact Your Local Health Department Not all health departments have doctors that can see patients for sick visits, but many do, so it is worth a call to see if yours does. If you don't know where your local health department is, you can check in your phone book. The CDC also has a tool to help you locate your state's health department, and many state websites also have listings of all of their local health departments.  3) Find a [facility] If your illness is not likely to be very severe or complicated, you may want to try a walk in clinic. These are popping up all over the country in pharmacies, drugstores, and shopping centers.  They're usually staffed by nurse practitioners or physician assistants that have been trained to treat common illnesses and complaints. They're usually fairly quick and inexpensive. However, if you have serious medical issues or chronic medical problems, these are probably not your best option.  No Primary Care Doctor: - Call Health Connect at  [phone] - they can help you locate a primary care doctor that  accepts your insurance, provides certain services, etc. - Physician Referral Service- [phone]  Chronic Pain Problems: Organization         Address  Phone   Notes  [facility]  [phone] Patients need to be referred by their primary care doctor.   Medication Assistance: Organization         Address  Phone   Notes  [facility] Medication [facility] [address] [place], [place] [address] [phone] --Must be a resident of [facility] -- Must have NO insurance coverage whatsoever (no Medicaid/ Medicare, etc.) -- The pt. MUST have a primary care doctor that directs their care regularly and follows them in the community   MedAssist  [phone]   [employer]  [phone]    Agencies that provide inexpensive medical care: Organization         Address  Phone   Notes  [place]  [phone]   [facility]    [phone]   [facility] [place], [place] [address] [phone]   [place] [address] [place] [phone]   Planned Parenthood    [phone]   [facility]    [phone]   [place] and [address], [place] Phone:  [phone], Fax:  [phone] Hours of Operation:  9 am - 6 pm, M-F.  Also accepts Medicaid/Medicare and self-pay.  [facility] for [address], [address], [place] Phone: [phone], Fax: [phone]. Hours of Operation:  8:30 am - 5:30 pm, M-F.  Also accepts Medicaid and self-pay.  [facility] [address] [place] Phone: [phone]   [place], [place], [place] [phone], Ext. [phone] Mondays & Thursdays: 7-9 AM.  First 15 patients are seen on a first come, first serve basis.    [place] Providers:  Organization         Address  Phone   Notes  [facility] [address], [place] [phone] Also accepts self-pay patients.  [place] [id number] [place],  [address] [place]  [phone]   [address], [place] [phone]   [place] [address] [place] [phone]   Lucianne Lei [address], [place]   [phone] Only accepts [place] Access [place] patients after they have their name applied to their card.   Self-Pay (no insurance) in [facility]:  Organization         Address  Phone   Notes  Sickle Cell Patients, [facility] [place] [phone]   [facility] [place] [phone]   [facility] [place]  [place], [place], [place] [phone]   [facility]/Dr. Osei-Bonsu  [address] [place] or [address], [place] [phone] Phone number for both [place] and [place] locations is the same.  [facility] [address], [place] [phone]   [facility] [address] [place] or [address] [phone]   [facility] [address] [place] [phone], phone; [phone], fax Sees patients 1st and 3rd Saturday of every month.  Must not qualify for public or private insurance (i.e.  Medicaid, Medicare, [place] Health Choice, Veterans' Benefits)  Household income should be no more than 200% of the poverty level The clinic cannot treat you if you are pregnant or think you are pregnant  Sexually transmitted diseases are not treated at the clinic.    Dental Care: Organization         Address  Phone  Notes  [facility] Department of [facility] [place] [phone] Accepts children up to age 41 who are enrolled in [place] or [place]; pregnant women with a Medicaid card; and children who have applied for Medicaid or [place] Health Choice, but were declined, whose parents can pay a reduced fee at time of service.  [facility] Department of [facility]  [address], [place] [phone] Accepts children up to age 6 who are enrolled in [place] or [place]; pregnant women with a Medicaid card; and children who have applied for Medicaid or [place] Health Choice, but were declined, whose parents can pay a reduced fee at time of service.  [facility] Adult [facility] Access PROGRAM  [place] [phone] Patients are seen by appointment only. Walk-ins are not accepted. [place] will see patients 37 years of age and older. Monday - Tuesday (8am-5pm) Most Wednesdays (8:30-5pm) $30 per visit, cash only  [facility] Adult Dental Access PROGRAM  [address], [facility] [phone] Patients are seen by appointment only. Walk-ins are not accepted. [place] will see patients 13 years of age and older. One Wednesday Evening (Monthly: Volunteer Based).  $30 per visit, cash only  [facility]  [phone] for adults; Children under age 74, call Graduate Pediatric Dentistry at [phone]. Children aged 14-14, please call [phone] to request a pediatric application.  Dental services are provided in all areas of dental care including fillings,  crowns and bridges, complete and partial dentures, implants, gum treatment, root canals, and extractions. Preventive care is also provided. Treatment is provided to both adults and children. Patients are selected via a  lottery and there is often a waiting list.   [facility] [address], [place]  [phone] [email or web address]   [facility] [address] [place], [place] [phone], Ext. [phone] Second and Fourth Thursday of each month, opens at 6:30 AM; Clinic ends at 9 AM.  Patients are seen on a first-come first-served basis, and a limited number are seen during each clinic.   [facility]  [address] Hillard Danker [place], [place] [phone]   Eligibility Requirements You must have lived in [place], [place], or [place] counties for at least the last three months.   You cannot be eligible for state or federal sponsored [employer], including [employer], [place], or [employer].   You generally cannot be eligible for healthcare insurance through your employer.    How to apply: Eligibility screenings are held every Tuesday and Wednesday afternoon from 1:00 pm until 4:00 pm. You do not need an appointment for the interview!  [facility] [address], [place], [place]   [place]  [place] Department  [place]  [phone]    Behavioral Health Resources in the Community: Intensive Outpatient Programs Organization         Address  Phone  Notes  [place]. [address], [place], [place] [phone]   [facility] Outpatient [address], [place], [place]   ADS: Alcohol & Drug Svcs [address], [place], [place]   [place] [address],  [place], [place] or [phone]   Substance Abuse  Resources Organization         Address  Phone  Notes  Alcohol and Drug Services  [phone]   [place]  [phone]   [facility] [place]   Chinita Pester  [phone]   Residential & Outpatient Substance Abuse Program  [phone]   Psychological Services Organization         Address  Phone  Notes  [facility]  [place]  [phone]   [place] [address], [place] or [phone]    Mobile Crisis Teams Organization         Address  Phone  Notes  [facility], Mobile Crisis Care Unit  [phone]   [facility]ive Psychotherapeutic Services  [address] [place], [place]   [place] Levels [address], [place] [phone]    Self-Help/Support Groups Organization         Address  Phone             Notes  [place]. of [place] - variety of support groups  [place] Call for more information  Narcotics Anonymous (NA), [facility] [address], [employer] [place]  2 meetings at this location   Special educational needs teacher         Address  Phone  Notes  [facility]idential [facility] [place],    [place]  [phone]   [facility]  [address], [place] [id number], [place], [place]   [place], [place] [phone] Admissions: 8am-3pm M-F  Incentives Substance [place] [address],    [place], [place] [phone]   [facility] [address], [place],  [place] [phone]   [facility] [address]  [place], [place]   [facility] - Intensive Outpatient [address], [place], [place]   [facility] ([place].,  [place], [place] [phone] or [phone]   [facility] Services (RTS) [address], [place], [place] Accepts Medicaid  [facility] [place] [address]  [place] [phone] Substance Abuse/Addiction Treatment   [facility] Organization         Address  Phone  Notes  [facility]  [phone]   Domenic Schwab, PhD [address] Arlis Porta [place], [place]   [phone] or [phone]   [place], [place] [phone]   [place] [address], [place], [place] [phone] Insurance/Medicaid/sponsorship through [facility] and Families [address] [place]                                    [place], [place] [phone] [place] [address], [place] [phone]    Dr. Adele Schilder  [phone]   [facility] Dept. 1) [address], [place] 2) [place] 3)  [place] [address], [place] [phone]  [phone]   [place] [phone] or [phone] (After Hours)

## 2016-01-14 ENCOUNTER — Encounter: Payer: Self-pay | Admitting: Family Medicine

## 2016-01-14 ENCOUNTER — Ambulatory Visit (INDEPENDENT_AMBULATORY_CARE_PROVIDER_SITE_OTHER): Payer: Self-pay | Admitting: Family Medicine

## 2016-01-14 VITALS — BP 90/62 | HR 63 | Temp 97.9°F | Ht 64.0 in | Wt 173.9 lb

## 2016-01-14 DIAGNOSIS — Z635 Disruption of family by separation and divorce: Secondary | ICD-10-CM

## 2016-01-14 DIAGNOSIS — E785 Hyperlipidemia, unspecified: Secondary | ICD-10-CM

## 2016-01-14 DIAGNOSIS — I2699 Other pulmonary embolism without acute cor pulmonale: Secondary | ICD-10-CM

## 2016-01-14 NOTE — Patient Instructions (Signed)
He schedule  Physical exam in about 4-6 months.   These let us know if you need any assistive regarding insurance or your medications.  We recommend the following healthy lifestyle measures: - eat a healthy whole foods diet consisting of regular small meals composed of vegetables, fruits, beans, nuts, seeds, healthy meats such as white chicken and fish and whole grains.  - avoid sweets, white starchy foods, fried foods, fast food, processed foods, sodas, red meet and other fattening foods.  - get a least 150-300 minutes of aerobic exercise per week.

## 2016-01-14 NOTE — Progress Notes (Signed)
HPI:  Sarah Leach is a very pleasant 35 yo here for follow up. She  Has a hx of hypercoagulable state and suffered a pulmonary embolism in 09/2015. She is on elequis and is under the care of Dr. Alvy Bimler. Appreciate recent recommendations and thorough and thoughtful care. Unfortunately, she recently separated from her husband. She is doing okay, is living with the parents of a friend and they have been very supportive. 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 She currently has no insurance and requests that no lab work or testing be done today. She reports she is in the process of getting Medicaid and feels confident that this will go through. She runs out of her Eliquis in 3 days. She has follow-up with her oncologist next week. Denies chest pain, bleeding,difficulty breathing, leg swelling. No regular exercise. Diet not great.   ROS: See pertinent positives and negatives per HPI.  Past Medical History  Diagnosis Date  . Pure hypercholesterolemia 08/16/2014  . Osteoarthritis of knees 01/16/2015  . Bilateral headaches 01/16/2015    -chronic -associated with her menstrual periods   . Anemia     resolved, mild for some time - does give plasma twice per week which is likely etiology  . Blood clot associated with vein wall inflammation   . Pulmonary embolism ([place]) 09/29/2015    Factor V Leiden mutation, on elequis     Past Surgical History  Procedure Laterality Date  . Tubal ligation  2010    Family History  Problem Relation Age of Onset  . Arthritis Maternal Grandmother   . Hearing loss Maternal Grandmother   . Arthritis Maternal Grandfather   . Hearing loss Maternal Grandfather   . Clotting disorder Neg Hx     Social History   Social History  . Marital Status: Married    Spouse Name: N/A  . Number of Children: N/A  . Years of Education: N/A   Social History Main Topics  . Smoking status: Never Smoker   .  Smokeless tobacco: Never Used  . Alcohol Use: No  . Drug Use: No  . Sexual Activity: Yes    Birth Control/ Protection: None   Other Topics Concern  . None   Social History Narrative   Work or School: works at [employer] Situation: lives with husband, 4 children - none of them live with them      Spiritual Beliefs: Christian      Lifestyle: walks to work daily - 20 minutes; diet is ok - eats a lot at [employer]           Current outpatient prescriptions:  .  apixaban (ELIQUIS) 5 MG TABS tablet, Take 1 tablet (5 mg total) by mouth 2 (two) times daily., Disp: 56 tablet, Rfl: 6 .  ibuprofen (ADVIL,MOTRIN) 200 MG tablet, Take 400 mg by mouth every 6 (six) hours as needed for headache or mild pain., Disp: , Rfl:   EXAM:  Filed Vitals:   01/14/16 0929  BP: 90/62  Pulse: 63  Temp: 97.9 F (36.6 C)    Body mass index is 29.84 kg/(m^2).  GENERAL: vitals reviewed and listed above, alert, oriented, appears well hydrated and in no acute distress  HEENT: atraumatic, conjunttiva clear, no obvious abnormalities on inspection of external nose and ears  NECK: no obvious masses on inspection  LUNGS: clear to auscultation bilaterally, no wheezes, rales or rhonchi, good air movement  CV: HRRR, no peripheral edema  MS: moves all extremities without noticeable abnormality  PSYCH: pleasant and cooperative, no obvious depression or anxiety  ASSESSMENT AND PLAN:  Discussed the following assessment and plan:  Other acute pulmonary embolism (HCC)  Hyperlipidemia  Disruption of family by separation and divorce  - Samples of Eliquis provided - reported and counseled on recent loss, she currently cannot afford counseling, but may consider once she isn't sure - lifestyle recommendations - she is due for a physical, but declined this or any labs today until she has insurance -Offered for her to meet with our office administrator to discuss options for helping her with Medicaid  application or other options for insurance, she declined today is currently is in this process. -Patient advised to return or notify a doctor immediately if symptoms worsen or persist or new concerns arise.  Patient Instructions   He schedule  Physical exam in about 4-6 months.   These let us know if you need any assistive regarding insurance or your medications.  We recommend the following healthy lifestyle measures: - eat a healthy whole foods diet consisting of regular small meals composed of vegetables, fruits, beans, nuts, seeds, healthy meats such as white chicken and fish and whole grains.  - avoid sweets, white starchy foods, fried foods, fast food, processed foods, sodas, red meet and other fattening foods.  - get a least 150-300 minutes of aerobic exercise per week.       Colin Benton R.

## 2016-01-14 NOTE — Progress Notes (Signed)
Pre visit review using our clinic review tool, if applicable. No additional management support is needed unless otherwise documented below in the visit note. 

## 2016-01-20 ENCOUNTER — Ambulatory Visit (HOSPITAL_BASED_OUTPATIENT_CLINIC_OR_DEPARTMENT_OTHER): Payer: Self-pay | Admitting: Hematology and Oncology

## 2016-01-20 ENCOUNTER — Encounter: Payer: Self-pay | Admitting: Hematology and Oncology

## 2016-01-20 ENCOUNTER — Telehealth: Payer: Self-pay | Admitting: Hematology and Oncology

## 2016-01-20 ENCOUNTER — Other Ambulatory Visit (HOSPITAL_BASED_OUTPATIENT_CLINIC_OR_DEPARTMENT_OTHER): Payer: Self-pay

## 2016-01-20 VITALS — BP 129/75 | HR 64 | Temp 98.1°F | Resp 18 | Wt 172.6 lb

## 2016-01-20 DIAGNOSIS — I82503 Chronic embolism and thrombosis of unspecified deep veins of lower extremity, bilateral: Secondary | ICD-10-CM

## 2016-01-20 DIAGNOSIS — I2699 Other pulmonary embolism without acute cor pulmonale: Secondary | ICD-10-CM

## 2016-01-20 DIAGNOSIS — I2609 Other pulmonary embolism with acute cor pulmonale: Secondary | ICD-10-CM

## 2016-01-20 DIAGNOSIS — Z7901 Long term (current) use of anticoagulants: Secondary | ICD-10-CM

## 2016-01-20 DIAGNOSIS — I80291 Phlebitis and thrombophlebitis of other deep vessels of right lower extremity: Secondary | ICD-10-CM

## 2016-01-20 DIAGNOSIS — I82501 Chronic embolism and thrombosis of unspecified deep veins of right lower extremity: Secondary | ICD-10-CM

## 2016-01-20 LAB — CBC WITH DIFFERENTIAL/PLATELET
BASO%: 0.7 % (ref 0.0–2.0)
BASOS ABS: 0 10*3/uL (ref 0.0–0.1)
EOS ABS: 0.1 10*3/uL (ref 0.0–0.5)
EOS%: 1.7 % (ref 0.0–7.0)
HEMATOCRIT: 39.1 % (ref 34.8–46.6)
HGB: 12.9 g/dL (ref 11.6–15.9)
LYMPH#: 1.7 10*3/uL (ref 0.9–3.3)
LYMPH%: 27.7 % (ref 14.0–49.7)
MCH: 28.5 pg (ref 25.1–34.0)
MCHC: 32.9 g/dL (ref 31.5–36.0)
MCV: 86.6 fL (ref 79.5–101.0)
MONO#: 0.5 10*3/uL (ref 0.1–0.9)
MONO%: 8.2 % (ref 0.0–14.0)
NEUT#: 3.8 10*3/uL (ref 1.5–6.5)
NEUT%: 61.7 % (ref 38.4–76.8)
PLATELETS: 407 10*3/uL — AB (ref 145–400)
RBC: 4.51 10*6/uL (ref 3.70–5.45)
RDW: 14.5 % (ref 11.2–14.5)
WBC: 6.2 10*3/uL (ref 3.9–10.3)

## 2016-01-20 LAB — COMPREHENSIVE METABOLIC PANEL
ALT: <9 U/L (ref 0–55)
ANION GAP: 8 meq/L (ref 3–11)
AST: 14 U/L (ref 5–34)
Albumin: 4 g/dL (ref 3.5–5.0)
Alkaline Phosphatase: 60 U/L (ref 40–150)
BUN: 10.7 mg/dL (ref 7.0–26.0)
CALCIUM: 9.5 mg/dL (ref 8.4–10.4)
CHLORIDE: 109 meq/L (ref 98–109)
CO2: 26 meq/L (ref 22–29)
Creatinine: 1 mg/dL (ref 0.6–1.1)
EGFR: 74 mL/min/{1.73_m2} — AB (ref >90)
Glucose: 85 mg/dl (ref 70–140)
POTASSIUM: 4 meq/L (ref 3.5–5.1)
Sodium: 142 mEq/L (ref 136–145)
Total Bilirubin: 0.45 mg/dL (ref 0.20–1.20)
Total Protein: 8 g/dL (ref 6.4–8.3)

## 2016-01-20 NOTE — Telephone Encounter (Signed)
Gave and printed appt shced and avs for pt for DEC.Marland Kitchenthe patient ok adn aware

## 2016-01-20 NOTE — Progress Notes (Signed)
[place] OFFICE PROGRESS NOTE  [name]., DO SUMMARY OF HEMATOLOGIC HISTORY: Sarah Leach 35 y.o. female is here because of recent diagnosis of blood clot.  She was hospitalized between 09/29/2015 to 10/03/2015 after presentation with pleuritic chest pain and progressive dyspnea. She underwent extensive evaluation and was found to have significant right lower extremity DVT and PE. She underwent anticoagulation therapy and was subsequently discharged on eloquence. Since hospitalization, pleuritic chest pain is improving. She denies difficulties with breathing or hemoptysis. She denies bleeding complication from Eliquis. Prior to admission, she denies recent history of trauma, long distance travel, dehydration, recent surgery, smoking or prolonged immobilization. She had no prior history or diagnosis of cancer. Her age appropriate screening programs are up-to-date. She had prior surgeries before and never had perioperative thromboembolic events. The patient had been exposed to birth control pills and never had thrombotic events. The patient had been pregnant before and denies history of peripartum thromboembolic event or history of recurrent miscarriages. There is no family history of blood clots or miscarriages.  INTERVAL HISTORY: Sarah Leach 35 y.o. female returns for further follow-up. She shared sad news that she is currently separated from her husband. She is still waiting for approval from [facility] The patient denies any recent signs or symptoms of bleeding such as spontaneous epistaxis, hematuria or hematochezia. She complained of leg cramps. She went to the emergency department recently and have repeat ultrasound venous Doppler on 12/17/2015. 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 and peroneal veins of the right lower extremity She denies leg swelling.  I have reviewed the past  medical history, past surgical history, social history and family history with the patient and they are unchanged from previous note.  ALLERGIES:  has No Known Allergies.  MEDICATIONS:  Current Outpatient Prescriptions  Medication Sig Dispense Refill  . apixaban (ELIQUIS) 5 MG TABS tablet Take 1 tablet (5 mg total) by mouth 2 (two) times daily. 56 tablet 6   No current facility-administered medications for this visit.     REVIEW OF SYSTEMS:   Constitutional: Denies fevers, chills or night sweats Eyes: Denies blurriness of vision Ears, nose, mouth, throat, and face: Denies mucositis or sore throat Respiratory: Denies cough, dyspnea or wheezes Cardiovascular: Denies palpitation, chest discomfort or lower extremity swelling Gastrointestinal:  Denies nausea, heartburn or change in bowel habits Skin: Denies abnormal skin rashes Lymphatics: Denies new lymphadenopathy or easy bruising Neurological:Denies numbness, tingling or new weaknesses Behavioral/Psych: Mood is stable, no new changes  All other systems were reviewed with the patient and are negative.  PHYSICAL EXAMINATION: ECOG PERFORMANCE STATUS: 0 - Asymptomatic  Filed Vitals:   01/20/16 1323  BP: 129/75  Pulse: 64  Temp: 98.1 F (36.7 C)  Resp: 18   Filed Weights   01/20/16 1323  Weight: 172 lb 9.6 oz (78.291 kg)    GENERAL:alert, no distress and comfortable SKIN: skin color, texture, turgor are normal, no rashes or significant lesions EYES: normal, Conjunctiva are pink and non-injected, sclera clear HEART: no lower extremity edema Musculoskeletal:no cyanosis of digits and no clubbing  NEURO: alert & oriented x 3 with fluent speech, no focal motor/sensory deficits  LABORATORY DATA:  I have reviewed the data as listed Results for orders placed or performed in visit on 01/20/16 (from the past 48 hour(s))  CBC with Differential/Platelet     Status: Abnormal   Collection Time: 01/20/16  1:15 PM  Result Value Ref  Range  WBC 6.2 3.9 - 10.3 10e3/uL   NEUT# 3.8 1.5 - 6.5 10e3/uL   HGB 12.9 11.6 - 15.9 g/dL   HCT 39.1 34.8 - 46.6 %   Platelets 407 (H) 145 - 400 10e3/uL   MCV 86.6 79.5 - 101.0 fL   MCH 28.5 25.1 - 34.0 pg   MCHC 32.9 31.5 - 36.0 g/dL   RBC 4.51 3.70 - 5.45 10e6/uL   RDW 14.5 11.2 - 14.5 %   lymph# 1.7 0.9 - 3.3 10e3/uL   MONO# 0.5 0.1 - 0.9 10e3/uL   Eosinophils Absolute 0.1 0.0 - 0.5 10e3/uL   Basophils Absolute 0.0 0.0 - 0.1 10e3/uL   NEUT% 61.7 38.4 - 76.8 %   LYMPH% 27.7 14.0 - 49.7 %   MONO% 8.2 0.0 - 14.0 %   EOS% 1.7 0.0 - 7.0 %   BASO% 0.7 0.0 - 2.0 %  Comprehensive metabolic panel     Status: Abnormal   Collection Time: 01/20/16  1:15 PM  Result Value Ref Range   Sodium 142 136 - 145 mEq/L   Potassium 4.0 3.5 - 5.1 mEq/L   Chloride 109 98 - 109 mEq/L   CO2 26 22 - 29 mEq/L   Glucose 85 70 - 140 mg/dl    Comment: Glucose reference range is for nonfasting patients. Fasting glucose reference range is 70- 100.   BUN 10.7 7.0 - 26.0 mg/dL   Creatinine 1.0 0.6 - 1.1 mg/dL   Total Bilirubin 0.45 0.20 - 1.20 mg/dL   Alkaline Phosphatase 60 40 - 150 U/L   AST 14 5 - 34 U/L   ALT <9 0 - 55 U/L   Total Protein 8.0 6.4 - 8.3 g/dL   Albumin 4.0 3.5 - 5.0 g/dL   Calcium 9.5 8.4 - 10.4 mg/dL   Anion Gap 8 3 - 11 mEq/L   EGFR 74 (L) >90 ml/min/1.73 m2    Comment: eGFR is calculated using the CKD-EPI Creatinine Equation (2009)    Lab Results  Component Value Date   WBC 6.2 01/20/2016   HGB 12.9 01/20/2016   HCT 39.1 01/20/2016   MCV 86.6 01/20/2016   PLT 407* 01/20/2016    ASSESSMENT & PLAN:  Pulmonary embolism ([place]) Per previous discussion, she needs to remain on anticoagulation therapy at least until December 2017. I am concerned about her current financial situation. The patient reassured me that she has reapplied to Medicaid after separation from her husband to obtain insurance coverage. She is doing well without bleeding complication. I will see her back at  the end of the year to discuss whether it will be appropriate to discontinue her treatment then.  Thrombophlebitis of right leg She has minor leg cramps. Recent ultrasound venous Dopplers show chronic DVT changes. I recommend leg elevation as much as possible when she is at rest. It is likely due to of thrombophlebitis. Nothing new needs to be added at this point   All questions were answered. The patient knows to call the clinic with any problems, questions or concerns. No barriers to learning was detected.  I spent 15 minutes counseling the patient face to face. The total time spent in the appointment was 20 minutes and more than 50% was on counseling.     Alvy Bimler, Verna Hamon, MD 4/4/20172:28 PM

## 2016-01-20 NOTE — Assessment & Plan Note (Signed)
Per previous discussion, she needs to remain on anticoagulation therapy at least until December 2017. I am concerned about her current financial situation. The patient reassured me that she has reapplied to Medicaid after separation from her husband to obtain insurance coverage. She is doing well without bleeding complication. I will see her back at the end of the year to discuss whether it will be appropriate to discontinue her treatment then.

## 2016-01-20 NOTE — Telephone Encounter (Signed)
Gave adn printed appt sched and avs for pt for DEc °

## 2016-01-20 NOTE — Assessment & Plan Note (Signed)
She has minor leg cramps. Recent ultrasound venous Dopplers show chronic DVT changes. I recommend leg elevation as much as possible when she is at rest. It is likely due to of thrombophlebitis. Nothing new needs to be added at this point

## 2016-02-05 ENCOUNTER — Telehealth: Payer: Self-pay | Admitting: Family Medicine

## 2016-02-05 MED ORDER — APIXABAN 5 MG PO TABS: 5.0000 mg | ORAL_TABLET | Freq: Two times a day (BID) | ORAL | Status: DC

## 2016-02-05 NOTE — Telephone Encounter (Signed)
Pt ask if we have samples of ELIQUIS

## 2016-02-05 NOTE — Telephone Encounter (Signed)
I left a month's supply at the front desk and Sarah Leach informed the pt of this.

## 2016-03-08 ENCOUNTER — Encounter: Payer: Self-pay | Admitting: Family Medicine

## 2016-03-08 ENCOUNTER — Ambulatory Visit (INDEPENDENT_AMBULATORY_CARE_PROVIDER_SITE_OTHER): Payer: Self-pay | Admitting: Family Medicine

## 2016-03-08 VITALS — BP 130/78 | HR 64 | Temp 98.1°F | Ht 64.0 in | Wt 176.5 lb

## 2016-03-08 DIAGNOSIS — R6884 Jaw pain: Secondary | ICD-10-CM

## 2016-03-08 DIAGNOSIS — N926 Irregular menstruation, unspecified: Secondary | ICD-10-CM

## 2016-03-08 LAB — POCT URINE PREGNANCY: PREG TEST UR: NEGATIVE

## 2016-03-08 MED ORDER — CYCLOBENZAPRINE HCL 5 MG PO TABS: 5.0000 mg | ORAL_TABLET | Freq: Every evening | ORAL | Status: DC | PRN

## 2016-03-08 NOTE — Patient Instructions (Addendum)
BEFORE YOU LEAVE: -upreg -TMJ exercises  Tylenol and heat as needed for pain  Exercises provided   Muscle relaxer at night if needed  Follow up if persists, worsening or new concerns

## 2016-03-08 NOTE — Progress Notes (Signed)
HPI:  Acute visit for:  L ear pain: -started: 1 - 1.5 weeks ago -symptoms: L ear and and TMJ joint region pain, nausea, vomiting 2x the first day of this, diarrhea the first few days -denies:fever, SOB, tooth pain -has tried: tylenol helps -sick contacts/travel/risks: no reported flu, strep or tick exposure -FDLMP: middle of April, feels has missed period -has been sexually active without protection  ROS: See pertinent positives and negatives per HPI.  Past Medical History  Diagnosis Date  . Pure hypercholesterolemia 08/16/2014  . Osteoarthritis of knees 01/16/2015  . Bilateral headaches 01/16/2015    -chronic -associated with her menstrual periods   . Anemia     resolved, mild for some time - does give plasma twice per week which is likely etiology  . Blood clot associated with vein wall inflammation   . Pulmonary embolism ([place]) 09/29/2015    Factor V Leiden mutation, on elequis     Past Surgical History  Procedure Laterality Date  . Tubal ligation  2010    Family History  Problem Relation Age of Onset  . Arthritis Maternal Grandmother   . Hearing loss Maternal Grandmother   . Arthritis Maternal Grandfather   . Hearing loss Maternal Grandfather   . Clotting disorder Neg Hx     Social History   Social History  . Marital Status: Married    Spouse Name: N/A  . Number of Children: N/A  . Years of Education: N/A   Social History Main Topics  . Smoking status: Never Smoker   . Smokeless tobacco: Never Used  . Alcohol Use: No  . Drug Use: No  . Sexual Activity: Yes    Birth Control/ Protection: None   Other Topics Concern  . None   Social History Narrative   Work or School: works at [employer] Situation: lives with husband, 4 children - none of them live with them      Spiritual Beliefs: Christian      Lifestyle: walks to work daily - 20 minutes; diet is ok - [place] lot [place] [employer]           Current outpatient prescriptions:  .  apixaban  (ELIQUIS) 5 MG TABS tablet, Take 1 tablet (5 mg total) by mouth 2 (two) times daily., Disp: 56 tablet, Rfl: 6  EXAM:  Filed Vitals:   03/08/16 1344  BP: 130/78  Pulse: 64  Temp: 98.1 F (36.7 C)    Body mass index is 30.28 kg/(m^2).  GENERAL: vitals reviewed and listed above, alert, oriented, appears well hydrated and in no acute distress  HEENT: atraumatic, conjunttiva clear, no obvious abnormalities on inspection of external nose and ears, normal appearance of ear canals and TMs, clear nasal congestion, mild post oropharyngeal erythema with PND, no tonsillar edema or exudate, no sinus TTP, TTP over the L TMJ jt, some deviation of jaw with opening and closing without sig clicking/catching/lockin  NECK: no obvious masses on inspection  LUNGS: clear to auscultation bilaterally, no wheezes, rales or rhonchi, good air movement  CV: HRRR, no peripheral edema  MS: moves all extremities without noticeable abnormality  PSYCH: pleasant and cooperative, no obvious depression or anxiety  ASSESSMENT AND PLAN:  Discussed the following assessment and plan:  Jaw pain -seems like TMJ jt dysfunction/muscle spasm -tx with flexeril, heat, tylenol and exercises, f/u if symptoms persist  Missed period - Plan: POCT urine pregnancy -upreg  Neg -safe sexual practices advised -suspect TMJ, HEP,  supportive measures, ENT eval if persists or worsens -upreg   Patient Instructions  BEFORE YOU LEAVE: -upreg -TMJ exercises  Tylenol and heat as needed for pain  Exercises provided   Muscle relaxer at night if needed  Follow up if persists, worsening or new concerns     KIM, HANNAH R.

## 2016-03-08 NOTE — Progress Notes (Signed)
Pre visit review using our clinic review tool, if applicable. No additional management support is needed unless otherwise documented below in the visit note. 

## 2016-04-05 ENCOUNTER — Telehealth: Payer: Self-pay | Admitting: Family Medicine

## 2016-05-05 NOTE — Telephone Encounter (Signed)
Disregard

## 2016-05-20 ENCOUNTER — Ambulatory Visit (INDEPENDENT_AMBULATORY_CARE_PROVIDER_SITE_OTHER): Payer: Self-pay | Admitting: Family Medicine

## 2016-05-20 ENCOUNTER — Encounter: Payer: Self-pay | Admitting: Family Medicine

## 2016-05-20 ENCOUNTER — Ambulatory Visit (INDEPENDENT_AMBULATORY_CARE_PROVIDER_SITE_OTHER)
Admission: RE | Admit: 2016-05-20 | Discharge: 2016-05-20 | Disposition: A | Discharge status: Home / Self Care | Payer: Self-pay | Source: Ambulatory Visit | Attending: Family Medicine | Admitting: Family Medicine

## 2016-05-20 VITALS — BP 118/68 | HR 65 | Temp 98.3°F | Ht 64.0 in | Wt 175.8 lb

## 2016-05-20 DIAGNOSIS — M25512 Pain in left shoulder: Secondary | ICD-10-CM

## 2016-05-20 MED ORDER — CYCLOBENZAPRINE HCL 5 MG PO TABS: 5.0000 mg | ORAL_TABLET | Freq: Two times a day (BID) | ORAL | 0 refills | Status: DC | PRN

## 2016-05-20 NOTE — Progress Notes (Signed)
Pre visit review using our clinic review tool, if applicable. No additional management support is needed unless otherwise documented below in the visit note. 

## 2016-05-20 NOTE — Progress Notes (Signed)
HPI:  Sarah Leach is a pleasant 35 year old here for an acute visit for left arm pain. She reports she rolled over in bed 3 days ago and felt a popping sensation in the left shoulder. Since then she has pain in the supraspinatus muscle region and around the left shoulder. She has been holding the shoulder still, as she is afraid to move it. Denies radiation of pain, fevers, malaise, weakness or numbness. Denies a history of dislocation or prior such shoulder pain. She is not currently working.  ROS: See pertinent positives and negatives per HPI.  Past Medical History:  Diagnosis Date  . Anemia    resolved, mild for some time - does give plasma twice per week which is likely etiology  . Bilateral headaches 01/16/2015   -chronic -associated with her menstrual periods   . Blood clot associated with vein wall inflammation   . Osteoarthritis of knees 01/16/2015  . Pulmonary embolism ([place]) 09/29/2015   Factor V Leiden mutation, on elequis   . Pure hypercholesterolemia 08/16/2014    Past Surgical History:  Procedure Laterality Date  . TUBAL LIGATION  2010    Family History  Problem Relation Age of Onset  . Arthritis Maternal Grandmother   . Hearing loss Maternal Grandmother   . Arthritis Maternal Grandfather   . Hearing loss Maternal Grandfather   . Clotting disorder Neg Hx     Social History   Social History  . Marital status: Married    Spouse name: N/A  . Number of children: N/A  . Years of education: N/A   Social History Main Topics  . Smoking status: Never Smoker  . Smokeless tobacco: Never Used  . Alcohol use No  . Drug use: No  . Sexual activity: Yes    Birth control/ protection: None   Other Topics Concern  . None   Social History Narrative   Work or School: works at [employer] Situation: lives with husband, 4 children - none of them live with them      Spiritual Beliefs: Christian      Lifestyle: walks to work daily - 20 minutes; diet is ok - eats  a lot [place] [employer]           Current Outpatient Prescriptions:  .  apixaban (ELIQUIS) 5 MG TABS tablet, Take 1 tablet (5 mg total) by mouth 2 (two) times daily., Disp: 56 tablet, Rfl: 6 .  cyclobenzaprine (FLEXERIL) 5 MG tablet, Take 1 tablet (5 mg total) by mouth 2 (two) times daily as needed for muscle spasms., Disp: 30 tablet, Rfl: 0  EXAM:  Vitals:   05/20/16 0843  BP: 118/68  Pulse: 65  Temp: 98.3 F (36.8 C)    Body mass index is 30.18 kg/m.  GENERAL: vitals reviewed and listed above, alert, oriented, appears well hydrated and in no acute distress  HEENT: atraumatic, conjunttiva clear, no obvious abnormalities on inspection of external nose and ears  NECK: no obvious masses on inspection  MS: moves all extremities without noticeable abnormality; she is holding of the left arm still, I do not appreciate any defect of the shoulder on inspection, she has tenderness to palpation in the left supraspinatus muscle and in the area of the rotator cuff attachments to the humerus, with coaching her strength in the bilateral upper extremities is equal in all motions of the shoulder, with coaching I'm able to move her shoulder in full range of motion - however she is quite  nervous about moving the shoulder, I do not appreciate any clicking or resistance with anterior and posterior stress on the humerus, neurovascularly intact distally   PSYCH: pleasant and cooperative, no obvious depression or anxiety  ASSESSMENT AND PLAN:  Discussed the following assessment and plan:  Left shoulder pain - Plan: DG Shoulder Left  -we discussed possible serious and likely etiologies, workup and treatment, treatment risks and return precautions suspect strain of the rotator cuff, but given the degree of pain and patient fear of movement will obtain plain films to exclude other as exam was limited -after this discussion, [name] opted for plain films to exclude dislocation versus other; then start home  exercise program,muscle relaxer, topical sports creams and/or Tylenol and heat for pain -follow up advised in 1 month or sooner if needed -of course, we advised [name]  to return or notify a doctor immediately if symptoms worsen or persist or new concerns arise.   Patient Instructions  BEFORE YOU LEAVE: -xray sheet -rotator cuff exercises -follow up: 1 month  Go get the xray today.  If xray ok start working on the exercises and do at least 4 days per week.  Tylenol [phone] mg up to 3 times daily if needed for pain. Topical over the counter sports products can also help, Johny Sax Balm is one of these options. Heat for 15 minutes 2-3 times daily. Flexeril 5 mg nightly for the next few nights and up to twice daily as needed.  I hope you feel better soon!   Colin Benton R., DO

## 2016-05-20 NOTE — Patient Instructions (Signed)
BEFORE YOU LEAVE: -xray sheet -rotator cuff exercises -follow up: 1 month  Go get the xray today.  If xray ok start working on the exercises and do at least 4 days per week.  Tylenol [phone] mg up to 3 times daily if needed for pain. Topical over the counter sports products can also help, Johny Sax Balm is one of these options. Heat for 15 minutes 2-3 times daily. Flexeril 5 mg nightly for the next few nights and up to twice daily as needed.  I hope you feel better soon!

## 2016-06-21 NOTE — Progress Notes (Signed)
HPI:  L shoulder pain: -injured rolling over in bed in august -plain films neg; treated with HEP, muscle relaxer, topical sports creams -reports: doing great! Much improved - occ pain R post lat shoulder with extensive abduction of shoulder but mild; is working out at [employer] on a regular basis -denies:denies weakness, numbness, catching, radiation  ROS: See pertinent positives and negatives per HPI.  Past Medical History:  Diagnosis Date  . Anemia    resolved, mild for some time - does give plasma twice per week which is likely etiology  . Bilateral headaches 01/16/2015   -chronic -associated with her menstrual periods   . Blood clot associated with vein wall inflammation   . Osteoarthritis of knees 01/16/2015  . Pulmonary embolism ([place]) 09/29/2015   Factor V Leiden mutation, on elequis   . Pure hypercholesterolemia 08/16/2014    Past Surgical History:  Procedure Laterality Date  . TUBAL LIGATION  2010    Family History  Problem Relation Age of Onset  . Arthritis Maternal Grandmother   . Hearing loss Maternal Grandmother   . Arthritis Maternal Grandfather   . Hearing loss Maternal Grandfather   . Clotting disorder Neg Hx     Social History   Social History  . Marital status: Married    Spouse name: N/A  . Number of children: N/A  . Years of education: N/A   Social History Main Topics  . Smoking status: Never Smoker  . Smokeless tobacco: Never Used  . Alcohol use No  . Drug use: No  . Sexual activity: Yes    Birth control/ protection: None   Other Topics Concern  . None   Social History Narrative   Work or School: works at [employer] Situation: lives with husband, 4 children - none of them live with them      Spiritual Beliefs: Christian      Lifestyle: walks to work daily - 20 minutes; diet is ok - [place] [employer]           Current Outpatient Prescriptions:  .  apixaban (ELIQUIS) 5 MG TABS tablet, Take 1 tablet (5 mg total) by mouth 2  (two) times daily., Disp: 56 tablet, Rfl: 6  EXAM:  Vitals:   06/22/16 0903  BP: 120/82  Pulse: 67  Temp: 98.1 F (36.7 C)    Body mass index is 29.33 kg/m.  GENERAL: vitals reviewed and listed above, alert, oriented, appears well hydrated and in no acute distress  HEENT: atraumatic, conjunttiva clear, no obvious abnormalities on inspection of external nose and ears  NECK: no obvious masses on inspection  LUNGS: clear to auscultation bilaterally, no wheezes, rales or rhonchi, good air movement  CV: HRRR, no peripheral edema  MS: moves all extremities without noticeable abnormality, normal ROM L shoulder, no TTP, normal strength throughout bilat UEs  PSYCH: pleasant and cooperative, no obvious depression or anxiety  ASSESSMENT AND PLAN:  Discussed the following assessment and plan:  Left shoulder pain  Factor V Leiden ([facility])  Hx pulmonary embolism  -pap smear advised - she agreed to schedule and do at physical -flu vaccine and tdap advised - declined, but agreed may do at physical -shoulder much better - continue HEP, advised on ly light weights at gym -Patient advised to return or notify a doctor immediately if symptoms worsen or persist or new concerns arise.  Patient Instructions  BEFORE YOU LEAVE: -follow up: schedule physical with pap in 2-3  months  Continue the shoulder exercises 4 days per week.   We recommend the following healthy lifestyle for LIFE: 1) Small portions.   Tip: eat off of a salad plate instead of a dinner plate.  Tip: It is ok to feel hungry after a meal - that likely means you ate an appropriate  Portion.  Tip: if you need more or a snack choose fruits, veggies and/or a handful of nuts  or seeds.  2) Eat a healthy clean diet.  * Tip: Avoid (less then 1 serving per week): processed foods, sweets, sweetened  drinks, white starches (rice, flour, bread, potatoes, pasta, etc), red meat, fast  foods, butter  *Tip: CHOOSE instead   * 5-9  servings per day of fresh or frozen fruits and vegetables (but   not corn, potatoes, bananas, canned or dried fruit)   *nuts and seeds, beans   *olives and olive oil   *small portions of lean meats such as fish and white chicken    *small portions of whole grains  3)Get at least 150 minutes of sweaty aerobic exercise per week.  4)Reduce stress - consider counseling, meditation and relaxation to balance other aspects of your life.     Colin Benton R., DO

## 2016-06-22 ENCOUNTER — Encounter: Payer: Self-pay | Admitting: Family Medicine

## 2016-06-22 ENCOUNTER — Ambulatory Visit (INDEPENDENT_AMBULATORY_CARE_PROVIDER_SITE_OTHER): Payer: Self-pay | Admitting: Family Medicine

## 2016-06-22 VITALS — BP 120/82 | HR 67 | Temp 98.1°F | Ht 64.0 in | Wt 170.9 lb

## 2016-06-22 DIAGNOSIS — D6851 Activated protein C resistance: Secondary | ICD-10-CM | POA: Insufficient documentation

## 2016-06-22 DIAGNOSIS — Z86711 Personal history of pulmonary embolism: Secondary | ICD-10-CM

## 2016-06-22 DIAGNOSIS — M25512 Pain in left shoulder: Secondary | ICD-10-CM

## 2016-06-22 NOTE — Progress Notes (Signed)
Pre visit review using our clinic review tool, if applicable. No additional management support is needed unless otherwise documented below in the visit note. 

## 2016-06-22 NOTE — Patient Instructions (Signed)
BEFORE YOU LEAVE: -follow up: schedule physical with pap in 2-3 months  Continue the shoulder exercises 4 days per week.   We recommend the following healthy lifestyle for LIFE: 1) Small portions.   Tip: eat off of a salad plate instead of a dinner plate.  Tip: It is ok to feel hungry after a meal - that likely means you ate an appropriate  Portion.  Tip: if you need more or a snack choose fruits, veggies and/or a handful of nuts  or seeds.  2) Eat a healthy clean diet.  * Tip: Avoid (less then 1 serving per week): processed foods, sweets, sweetened  drinks, white starches (rice, flour, bread, potatoes, pasta, etc), red meat, fast  foods, butter  *Tip: CHOOSE instead   * 5-9 servings per day of fresh or frozen fruits and vegetables (but   not corn, potatoes, bananas, canned or dried fruit)   *nuts and seeds, beans   *olives and olive oil   *small portions of lean meats such as fish and white chicken    *small portions of whole grains  3)Get at least 150 minutes of sweaty aerobic exercise per week.  4)Reduce stress - consider counseling, meditation and relaxation to balance other aspects of your life.

## 2016-09-03 ENCOUNTER — Other Ambulatory Visit (HOSPITAL_COMMUNITY)
Admission: RE | Admit: 2016-09-03 | Discharge: 2016-09-03 | Disposition: A | Discharge status: Home / Self Care | Payer: Self-pay | Source: Ambulatory Visit | Attending: Family Medicine | Admitting: Family Medicine

## 2016-09-03 ENCOUNTER — Ambulatory Visit (INDEPENDENT_AMBULATORY_CARE_PROVIDER_SITE_OTHER): Payer: Self-pay | Admitting: Family Medicine

## 2016-09-03 ENCOUNTER — Encounter: Payer: Self-pay | Admitting: Family Medicine

## 2016-09-03 VITALS — BP 92/70 | HR 57 | Temp 97.7°F | Ht 66.0 in | Wt 171.5 lb

## 2016-09-03 DIAGNOSIS — Z0000 Encounter for general adult medical examination without abnormal findings: Secondary | ICD-10-CM

## 2016-09-03 DIAGNOSIS — Z1151 Encounter for screening for human papillomavirus (HPV): Secondary | ICD-10-CM | POA: Insufficient documentation

## 2016-09-03 DIAGNOSIS — Z01419 Encounter for gynecological examination (general) (routine) without abnormal findings: Secondary | ICD-10-CM | POA: Insufficient documentation

## 2016-09-03 DIAGNOSIS — Z23 Encounter for immunization: Secondary | ICD-10-CM

## 2016-09-03 DIAGNOSIS — R899 Unspecified abnormal finding in specimens from other organs, systems and tissues: Secondary | ICD-10-CM

## 2016-09-03 DIAGNOSIS — Z86711 Personal history of pulmonary embolism: Secondary | ICD-10-CM

## 2016-09-03 DIAGNOSIS — E785 Hyperlipidemia, unspecified: Secondary | ICD-10-CM

## 2016-09-03 DIAGNOSIS — Z124 Encounter for screening for malignant neoplasm of cervix: Secondary | ICD-10-CM

## 2016-09-03 DIAGNOSIS — Z6827 Body mass index (BMI) 27.0-27.9, adult: Secondary | ICD-10-CM

## 2016-09-03 LAB — CBC WITH DIFFERENTIAL/PLATELET
BASOS ABS: 0 10*3/uL (ref 0.0–0.1)
BASOS PCT: 0.5 % (ref 0.0–3.0)
EOS ABS: 0.1 10*3/uL (ref 0.0–0.7)
Eosinophils Relative: 1.3 % (ref 0.0–5.0)
HCT: 37.5 % (ref 36.0–46.0)
Hemoglobin: 12.6 g/dL (ref 12.0–15.0)
Lymphocytes Relative: 25 % (ref 12.0–46.0)
Lymphs Abs: 1.6 10*3/uL (ref 0.7–4.0)
MCHC: 33.5 g/dL (ref 30.0–36.0)
MCV: 85.9 fl (ref 78.0–100.0)
MONO ABS: 0.4 10*3/uL (ref 0.1–1.0)
Monocytes Relative: 6.6 % (ref 3.0–12.0)
NEUTROS ABS: 4.2 10*3/uL (ref 1.4–7.7)
NEUTROS PCT: 66.6 % (ref 43.0–77.0)
PLATELETS: 498 10*3/uL — AB (ref 150.0–400.0)
RBC: 4.36 Mil/uL (ref 3.87–5.11)
RDW: 14.4 % (ref 11.5–15.5)
WBC: 6.3 10*3/uL (ref 4.0–10.5)

## 2016-09-03 LAB — LIPID PANEL
CHOL/HDL RATIO: 4
Cholesterol: 172 mg/dL (ref 0–200)
HDL: 43.2 mg/dL (ref >39.00)
LDL CALC: 102 mg/dL — AB (ref 0–99)
NONHDL: 128.76
TRIGLYCERIDES: 133 mg/dL (ref 0.0–149.0)
VLDL: 26.6 mg/dL (ref 0.0–40.0)

## 2016-09-03 LAB — BASIC METABOLIC PANEL
BUN: 9 mg/dL (ref 6–23)
CO2: 26 mEq/L (ref 19–32)
Calcium: 9.1 mg/dL (ref 8.4–10.5)
Chloride: 105 mEq/L (ref 96–112)
Creatinine, Ser: 0.84 mg/dL (ref 0.40–1.20)
GFR: 81.74 mL/min (ref >60.00)
Glucose, Bld: 96 mg/dL (ref 70–99)
POTASSIUM: 4.1 meq/L (ref 3.5–5.1)
SODIUM: 140 meq/L (ref 135–145)

## 2016-09-03 LAB — HEMOGLOBIN A1C: Hgb A1c MFr Bld: 5.8 % (ref 4.6–6.5)

## 2016-09-03 NOTE — Progress Notes (Signed)
Pre visit review using our clinic review tool, if applicable. No additional management support is needed unless otherwise documented below in the visit note. 

## 2016-09-03 NOTE — Patient Instructions (Signed)
BEFORE YOU LEAVE: -flu shot and Tdap -labs -follow up: 6 months and as needed  We have ordered labs or studies at this visit. It can take up to 1-2 weeks for results and processing. IF results require follow up or explanation, we will call you with instructions. Clinically stable results will be released to your [facility]. If you have not heard from [place] or cannot find your results in [facility] in 2 weeks please contact our office at [phone].  If you are not yet signed up for [facility], please consider signing up.   We recommend the following healthy lifestyle for LIFE: 1) Small portions.   Tip: eat off of a salad plate instead of a dinner plate.  Tip: if you need more or a snack choose fruits, veggies and/or a handful of nuts or seeds.  2) Eat a healthy clean diet.  * Tip: Avoid (less then 1 serving per week): processed foods, sweets, sweetened drinks, white starches (rice, flour, bread, potatoes, pasta, etc), red meat, fast foods, butter  *Tip: CHOOSE instead   * 5-9 servings per day of fresh or frozen fruits and vegetables (but not corn, potatoes, bananas, canned or dried fruit)   *nuts and seeds, beans   *olives and olive oil   *small portions of lean meats such as fish and white chicken    *small portions of whole grains  3)Get at least 150 minutes of sweaty aerobic exercise per week.  4)Reduce stress - consider counseling, meditation and relaxation to balance other aspects of your life.

## 2016-09-03 NOTE — Progress Notes (Signed)
HPI:  Here for CPE:  -Concerns and/or follow up today: none  -Diet: variety of foods, balance and well rounded, larger portion sizes  -Exercise: no regular exercise  -Taking folic acid, vitamin D or calcium: no  -Diabetes and Dyslipidemia Screening: FASTING for labs  -Hx of HTN: no  -Vaccines: UTD  -pap history: she is not sure, but > 2 years  -FDLMP: 08/27/16  -sexual activity: yes, female partner, no new partners  -wants STI testing (Hep C if born [birth year]): no  -FH breast, colon or ovarian ca: see FH Last mammogram: n/a Last colon cancer screening: n/a  Breast Ca Risk Assessment: -family an personal hx reviewed  -Alcohol, Tobacco, drug use: see social history  Review of Systems - no fevers, unintentional weight loss, vision loss, hearing loss, chest pain, sob, hemoptysis, melena, hematochezia, hematuria, genital discharge, changing or concerning skin lesions, bleeding, bruising, loc, thoughts of self harm or SI  Past Medical History:  Diagnosis Date  . Anemia    resolved, mild for some time - does give plasma twice per week which is likely etiology  . Bilateral headaches 01/16/2015   -chronic -associated with her menstrual periods   . Blood clot associated with vein wall inflammation   . Osteoarthritis of knees 01/16/2015  . Pulmonary embolism ([place]) 09/29/2015   Factor V Leiden mutation, on elequis   . Pure hypercholesterolemia 08/16/2014    Past Surgical History:  Procedure Laterality Date  . TUBAL LIGATION  2010    Family History  Problem Relation Age of Onset  . Arthritis Maternal Grandmother   . Hearing loss Maternal Grandmother   . Arthritis Maternal Grandfather   . Hearing loss Maternal Grandfather   . Clotting disorder Neg Hx     Social History   Social History  . Marital status: Married    Spouse name: N/A  . Number of children: N/A  . Years of education: N/A   Social History Main Topics  . Smoking status: Never Smoker  . Smokeless  tobacco: Never Used  . Alcohol use No  . Drug use: No  . Sexual activity: Yes    Birth control/ protection: None   Other Topics Concern  . None   Social History Narrative   Work or School: works at [employer] Situation: lives with husband, 4 children - none of them live with them      Spiritual Beliefs: Christian      Lifestyle: walks to work daily - 20 minutes; diet is ok - [place] lot [place] [employer]           Current Outpatient Prescriptions:  .  apixaban (ELIQUIS) 5 MG TABS tablet, Take 1 tablet (5 mg total) by mouth 2 (two) times daily., Disp: 56 tablet, Rfl: 6  EXAM:  Vitals:   09/03/16 0855  BP: 92/70  Pulse: (!) 57  Temp: 97.7 F (36.5 C)   Body mass index is 27.68 kg/m.   GENERAL: vitals reviewed and listed below, alert, oriented, appears well hydrated and in no acute distress  HEENT: head atraumatic, PERRLA, normal appearance of eyes, ears, nose and mouth. moist mucus membranes.  NECK: supple, no masses or lymphadenopathy  LUNGS: clear to auscultation bilaterally, no rales, rhonchi or wheeze  CV: HRRR, no peripheral edema or cyanosis, normal pedal pulses  BREAST: normal appearance - no lesions or discharge, on palpation normal breast tissue without any suspicious masses  ABDOMEN: bowel sounds normal, soft, non tender to palpation,  no masses, no rebound or guarding  GU: normal appearance of external genitalia - no lesions or masses, normal vaginal mucosa - no abnormal discharge, normal appearance of cervix - no lesions or abnormal discharge, no masses or tenderness appreciated on pelvic exam. Pap obtained  RECTAL: refused  SKIN: no rash or abnormal lesions  MS: normal gait, moves all extremities normally  NEURO: normal gait, speech and thought processing grossly intact, muscle tone grossly intact throughout  PSYCH: normal affect, pleasant and cooperative  ASSESSMENT AND PLAN:  Discussed the following assessment and plan:  Encounter for  preventive health examination - Plan: Basic metabolic panel  BMI 0000000 - Plan: Hemoglobin A1c  Hyperlipidemia, unspecified hyperlipidemia type - Plan: Lipid Panel  Hx pulmonary embolism - Plan: CBC with Differential/Platelets  Cervical cancer screening - Plan: PAP [[place]]  Encounter for immunization - Plan: Lipid Panel, Hemoglobin A1c, CBC with Differential/Platelets, Basic metabolic panel, PAP [[place]], Flu Vaccine QUAD 36+ mos IM  Need for diphtheria-tetanus-pertussis (Tdap) vaccine - Plan: Tdap vaccine greater than or equal to 7yo IM   -Discussed and advised all [place] preventive services health task force level A and B recommendations for age, sex and risks.  -Advised at least 150 minutes of exercise per week and a healthy diet with avoidance of (less then 1 serving per week) processed foods, white starches, red meat, fast foods and sweets and consisting of: * 5-9 servings of fresh fruits and vegetables (not corn or potatoes) *nuts and seeds, beans *olives and olive oil *lean meats such as fish and white chicken  *whole grains  -fasting labs, studies and vaccines per orders this encounter  Orders Placed This Encounter  Procedures  . Flu Vaccine QUAD 36+ mos IM  . Tdap vaccine greater than or equal to 7yo IM  . Lipid Panel  . Hemoglobin A1c  . CBC with Differential/Platelets  . Basic metabolic panel    Patient advised to return to clinic immediately if symptoms worsen or persist or new concerns.  Patient Instructions  BEFORE YOU LEAVE: -flu shot and Tdap -labs -follow up: 6 months and as needed  We have ordered labs or studies at this visit. It can take up to 1-2 weeks for results and processing. IF results require follow up or explanation, we will call you with instructions. Clinically stable results will be released to your [facility]. If you have not heard from [place] or cannot find your results in [facility] in 2 weeks please contact our office at  [phone].  If you are not yet signed up for [facility], please consider signing up.   We recommend the following healthy lifestyle for LIFE: 1) Small portions.   Tip: eat off of a salad plate instead of a dinner plate.  Tip: if you need more or a snack choose fruits, veggies and/or a handful of nuts or seeds.  2) Eat a healthy clean diet.  * Tip: Avoid (less then 1 serving per week): processed foods, sweets, sweetened drinks, white starches (rice, flour, bread, potatoes, pasta, etc), red meat, fast foods, butter  *Tip: CHOOSE instead   * 5-9 servings per day of fresh or frozen fruits and vegetables (but not corn, potatoes, bananas, canned or dried fruit)   *nuts and seeds, beans   *olives and olive oil   *small portions of lean meats such as fish and white chicken    *small portions of whole grains  3)Get at least 150 minutes of sweaty aerobic exercise per week.  4)Reduce  stress - consider counseling, meditation and relaxation to balance other aspects of your life.        No Follow-up on file.  Colin Benton R., DO

## 2016-09-06 LAB — CYTOLOGY - PAP
DIAGNOSIS: NEGATIVE
HPV (WINDOPATH): NOT DETECTED

## 2016-09-07 NOTE — Addendum Note (Signed)
Addended by: Agnes Lawrence on: 09/07/2016 01:25 PM   Modules accepted: Orders

## 2016-09-20 ENCOUNTER — Telehealth: Payer: Self-pay | Admitting: Family Medicine

## 2016-09-20 NOTE — Telephone Encounter (Signed)
° ° ° °  Pt would like a call back her pap smear results     [id number]

## 2016-09-20 NOTE — Telephone Encounter (Signed)
Pap normal. Thanks.

## 2016-09-21 NOTE — Telephone Encounter (Signed)
I called the pt and informed her of the results. 

## 2016-10-04 ENCOUNTER — Other Ambulatory Visit: Payer: Self-pay

## 2016-10-04 ENCOUNTER — Ambulatory Visit: Payer: Self-pay | Admitting: Hematology and Oncology

## 2016-10-05 ENCOUNTER — Encounter: Payer: Self-pay | Admitting: Hematology and Oncology

## 2016-10-07 ENCOUNTER — Other Ambulatory Visit: Payer: Self-pay

## 2016-10-07 ENCOUNTER — Other Ambulatory Visit (INDEPENDENT_AMBULATORY_CARE_PROVIDER_SITE_OTHER): Payer: Self-pay

## 2016-10-07 DIAGNOSIS — D6851 Activated protein C resistance: Secondary | ICD-10-CM

## 2016-10-07 LAB — CBC WITH DIFFERENTIAL/PLATELET
BASOS ABS: 0 10*3/uL (ref 0.0–0.1)
Basophils Relative: 0.3 % (ref 0.0–3.0)
EOS ABS: 0.1 10*3/uL (ref 0.0–0.7)
Eosinophils Relative: 1.2 % (ref 0.0–5.0)
HCT: 33.9 % — ABNORMAL LOW (ref 36.0–46.0)
HEMOGLOBIN: 11.7 g/dL — AB (ref 12.0–15.0)
LYMPHS ABS: 1.4 10*3/uL (ref 0.7–4.0)
Lymphocytes Relative: 22.3 % (ref 12.0–46.0)
MCHC: 34.5 g/dL (ref 30.0–36.0)
MCV: 85.8 fl (ref 78.0–100.0)
MONO ABS: 0.5 10*3/uL (ref 0.1–1.0)
Monocytes Relative: 7.8 % (ref 3.0–12.0)
NEUTROS PCT: 68.4 % (ref 43.0–77.0)
Neutro Abs: 4.4 10*3/uL (ref 1.4–7.7)
Platelets: 371 10*3/uL (ref 150.0–400.0)
RBC: 3.95 Mil/uL (ref 3.87–5.11)
RDW: 14.1 % (ref 11.5–15.5)
WBC: 6.5 10*3/uL (ref 4.0–10.5)

## 2016-10-13 ENCOUNTER — Other Ambulatory Visit: Payer: Self-pay | Admitting: Emergency Medicine

## 2016-10-13 DIAGNOSIS — D649 Anemia, unspecified: Secondary | ICD-10-CM

## 2016-11-04 ENCOUNTER — Other Ambulatory Visit: Payer: Self-pay

## 2016-11-04 ENCOUNTER — Ambulatory Visit: Payer: Self-pay | Admitting: Hematology and Oncology

## 2016-11-29 ENCOUNTER — Other Ambulatory Visit (HOSPITAL_BASED_OUTPATIENT_CLINIC_OR_DEPARTMENT_OTHER): Payer: Self-pay

## 2016-11-29 ENCOUNTER — Telehealth: Payer: Self-pay | Admitting: Hematology and Oncology

## 2016-11-29 ENCOUNTER — Ambulatory Visit (HOSPITAL_BASED_OUTPATIENT_CLINIC_OR_DEPARTMENT_OTHER): Payer: Self-pay | Admitting: Hematology and Oncology

## 2016-11-29 DIAGNOSIS — D6851 Activated protein C resistance: Secondary | ICD-10-CM

## 2016-11-29 DIAGNOSIS — D473 Essential (hemorrhagic) thrombocythemia: Secondary | ICD-10-CM

## 2016-11-29 DIAGNOSIS — Z86711 Personal history of pulmonary embolism: Secondary | ICD-10-CM

## 2016-11-29 DIAGNOSIS — I2609 Other pulmonary embolism with acute cor pulmonale: Secondary | ICD-10-CM

## 2016-11-29 DIAGNOSIS — D75839 Thrombocytosis, unspecified: Secondary | ICD-10-CM

## 2016-11-29 LAB — COMPREHENSIVE METABOLIC PANEL
ALBUMIN: 4.2 g/dL (ref 3.5–5.0)
ALK PHOS: 73 U/L (ref 40–150)
ALT: 8 U/L (ref 0–55)
ANION GAP: 9 meq/L (ref 3–11)
AST: 12 U/L (ref 5–34)
BUN: 13.5 mg/dL (ref 7.0–26.0)
CALCIUM: 10.1 mg/dL (ref 8.4–10.4)
CO2: 24 mEq/L (ref 22–29)
Chloride: 107 mEq/L (ref 98–109)
Creatinine: 0.9 mg/dL (ref 0.6–1.1)
EGFR: 82 mL/min/{1.73_m2} — AB (ref >90)
Glucose: 88 mg/dl (ref 70–140)
Potassium: 3.8 mEq/L (ref 3.5–5.1)
Sodium: 140 mEq/L (ref 136–145)
TOTAL PROTEIN: 8.5 g/dL — AB (ref 6.4–8.3)
Total Bilirubin: 0.4 mg/dL (ref 0.20–1.20)

## 2016-11-29 LAB — CBC WITH DIFFERENTIAL/PLATELET
BASO%: 0.3 % (ref 0.0–2.0)
Basophils Absolute: 0 10*3/uL (ref 0.0–0.1)
EOS%: 1 % (ref 0.0–7.0)
Eosinophils Absolute: 0.1 10*3/uL (ref 0.0–0.5)
HCT: 39 % (ref 34.8–46.6)
HEMOGLOBIN: 13.4 g/dL (ref 11.6–15.9)
LYMPH#: 2.1 10*3/uL (ref 0.9–3.3)
LYMPH%: 28.8 % (ref 14.0–49.7)
MCH: 29.5 pg (ref 25.1–34.0)
MCHC: 34.4 g/dL (ref 31.5–36.0)
MCV: 85.9 fL (ref 79.5–101.0)
MONO#: 0.5 10*3/uL (ref 0.1–0.9)
MONO%: 7 % (ref 0.0–14.0)
NEUT#: 4.5 10*3/uL (ref 1.5–6.5)
NEUT%: 62.9 % (ref 38.4–76.8)
NRBC: 0 % (ref 0–0)
Platelets: 435 10*3/uL — ABNORMAL HIGH (ref 145–400)
RBC: 4.54 10*6/uL (ref 3.70–5.45)
RDW: 13.3 % (ref 11.2–14.5)
WBC: 7.2 10*3/uL (ref 3.9–10.3)

## 2016-11-29 NOTE — Telephone Encounter (Signed)
Appointments scheduled per 2/12 LOS. Patient given AVS report and calendars with future scheduled appointments. °

## 2016-11-30 ENCOUNTER — Encounter: Payer: Self-pay | Admitting: Hematology and Oncology

## 2016-11-30 DIAGNOSIS — D75839 Thrombocytosis, unspecified: Secondary | ICD-10-CM | POA: Insufficient documentation

## 2016-11-30 DIAGNOSIS — D473 Essential (hemorrhagic) thrombocythemia: Secondary | ICD-10-CM | POA: Insufficient documentation

## 2016-11-30 NOTE — Assessment & Plan Note (Signed)
We discussed the implication of tested positive for factor V Leiden mutation. For now, there is no role for genetic testing of family members

## 2016-11-30 NOTE — Progress Notes (Signed)
[facility] OFFICE PROGRESS NOTE  Colin Benton R., DO SUMMARY OF HEMATOLOGIC HISTORY:  Sarah Leach was hospitalized between 09/29/2015 to 10/03/2015 after presentation with pleuritic chest pain and progressive dyspnea. Sarah Leach underwent extensive evaluation and was found to have significant right lower extremity DVT and PE. Sarah Leach underwent anticoagulation therapy and was subsequently discharged on eloquence. Since hospitalization, pleuritic chest pain is improving. Sarah Leach denies difficulties with breathing or hemoptysis. Sarah Leach denies bleeding complication from Eliquis. Prior to admission, Sarah Leach denies recent history of trauma, long distance travel, dehydration, recent surgery, smoking or prolonged immobilization. Sarah Leach had no prior history or diagnosis of cancer. Sarah Leach age appropriate screening programs are up-to-date. Sarah Leach had prior surgeries before and never had perioperative thromboembolic events. The Sarah Leach had been exposed to birth control pills and never had thrombotic events. The Sarah Leach had been pregnant before and denies history of peripartum thromboembolic event or history of recurrent miscarriages. There is no family history of blood clots or miscarriages. Sarah Leach went to the emergency department recently and have repeat ultrasound venous Doppler on 12/17/2015. 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 and peroneal veins of the right lower extremity  INTERVAL HISTORY: Sarah Leach 36 y.o. female returns for further follow-up. Sarah Leach is currently undergoing divorce. Sarah Leach is currently living with Sarah Leach boyfriend. Sarah Leach has been getting some free sample from Sarah Leach primary care physician's office and paying some of Sarah Leach medications out of pocket. Sarah Leach will run out of Eliquis in 1 week. The Sarah Leach denies any recent signs or symptoms of bleeding such as spontaneous epistaxis, hematuria or hematochezia. Sarah Leach denies recurrence of leg  swelling, chest pain or shortness of breath  I have reviewed the past medical history, past surgical history, social history and family history with the Sarah Leach and they are unchanged from previous note.  ALLERGIES:  has No Known Allergies.  MEDICATIONS:  Current Outpatient Prescriptions  Medication Sig Dispense Refill  . apixaban (ELIQUIS) 5 MG TABS tablet Take 1 tablet (5 mg total) by mouth 2 (two) times daily. 56 tablet 6   No current facility-administered medications for this visit.      REVIEW OF SYSTEMS:   Constitutional: Denies fevers, chills or night sweats Eyes: Denies blurriness of vision Ears, nose, mouth, throat, and face: Denies mucositis or sore throat Respiratory: Denies cough, dyspnea or wheezes Cardiovascular: Denies palpitation, chest discomfort or lower extremity swelling Gastrointestinal:  Denies nausea, heartburn or change in bowel habits Skin: Denies abnormal skin rashes Lymphatics: Denies new lymphadenopathy or easy bruising Neurological:Denies numbness, tingling or new weaknesses Behavioral/Psych: Mood is stable, no new changes  All other systems were reviewed with the Sarah Leach and are negative.  PHYSICAL EXAMINATION: ECOG PERFORMANCE STATUS: 0 - Asymptomatic  Vitals:   11/29/16 1244  BP: 137/60  Pulse: (!) 57  Resp: 18  Temp: 98.2 F (36.8 C)   Filed Weights   11/29/16 1244  Weight: 169 lb 14.4 oz (77.1 kg)    GENERAL:alert, no distress and comfortable SKIN: skin color, texture, turgor are normal, no rashes or significant lesions EYES: normal, Conjunctiva are pink and non-injected, sclera clear OROPHARYNX:no exudate, no erythema and lips, buccal mucosa, and tongue normal  NECK: supple, thyroid normal size, non-tender, without nodularity LYMPH:  no palpable lymphadenopathy in the cervical, axillary or inguinal LUNGS: clear to auscultation and percussion with normal breathing effort HEART: regular rate & rhythm and no murmurs and no lower  extremity edema ABDOMEN:abdomen soft, non-tender and normal bowel sounds Musculoskeletal:no  cyanosis of digits and no clubbing  NEURO: alert & oriented x 3 with fluent speech, no focal motor/sensory deficits  LABORATORY DATA:  I have reviewed the data as listed     Component Value Date/Time   NA 140 11/29/2016 1216   K 3.8 11/29/2016 1216   CL 105 09/03/2016 1004   CO2 24 11/29/2016 1216   GLUCOSE 88 11/29/2016 1216   BUN 13.5 11/29/2016 1216   CREATININE 0.9 11/29/2016 1216   CALCIUM 10.1 11/29/2016 1216   PROT 8.5 (H) 11/29/2016 1216   ALBUMIN 4.2 11/29/2016 1216   AST 12 11/29/2016 1216   ALT 8 11/29/2016 1216   ALKPHOS 73 11/29/2016 1216   BILITOT 0.40 11/29/2016 1216   GFRNONAA >60 12/17/2015 0845   GFRAA >60 12/17/2015 0845    No results found for: SPEP, UPEP  Lab Results  Component Value Date   WBC 7.2 11/29/2016   NEUTROABS 4.5 11/29/2016   HGB 13.4 11/29/2016   HCT 39.0 11/29/2016   MCV 85.9 11/29/2016   PLT 435 (H) 11/29/2016      Chemistry      Component Value Date/Time   NA 140 11/29/2016 1216   K 3.8 11/29/2016 1216   CL 105 09/03/2016 1004   CO2 24 11/29/2016 1216   BUN 13.5 11/29/2016 1216   CREATININE 0.9 11/29/2016 1216      Component Value Date/Time   CALCIUM 10.1 11/29/2016 1216   ALKPHOS 73 11/29/2016 1216   AST 12 11/29/2016 1216   ALT 8 11/29/2016 1216   BILITOT 0.40 11/29/2016 1216       ASSESSMENT & PLAN:  Hx pulmonary embolism Sarah Leach has completed one years worth of anticoagulation treatments. Sarah Leach is currently uninsured and had to pay all Sarah Leach medications out of pocket. Sarah Leach will run out of Sarah Leach anticoagulation therapy in 1 week. Sarah Leach previous PE was unprovoked. Sarah Leach was found to be heterozygous for factor V Leiden mutation. I recommend Sarah Leach to take 325 mg daily aspirin indefinitely as secondary prevention after Sarah Leach prescription medication runs out In Sarah Leach next visit, I plan to order additional test to exclude myeloproliferative  disorder as a cause of Sarah Leach recent DVT/PE We discussed risk factors that could be associated with increased risk of recurrence of DVT/PE  I reinforced the importance of staying active, avoiding tobacco abuse, avoiding hormonal contraceptives and to inform me if Sarah Leach should get pregnant for aggressive DVT prophylaxis.  Factor V Leiden ([place]) We discussed the implication of tested positive for factor V Leiden mutation. 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 I am wondering whether Sarah Leach could have undiagnosed myeloproliferative disorder such as essential thrombocytosis. I recommend aspirin therapy. I plan to check additional testing in Sarah Leach next visit.   Orders Placed This Encounter  Procedures  . CBC with Differential/Platelet    Standing Status:   Future    Standing Expiration Date:   01/04/2018  . Comprehensive metabolic panel    Standing Status:   Future    Standing Expiration Date:   01/04/2018  . Ferritin    Standing Status:   Future    Standing Expiration Date:   01/04/2018  . Sedimentation rate    Standing Status:   Future    Standing Expiration Date:   01/04/2018  . BCR-ABL    With RT-PCR technique    Standing Status:   Future    Standing Expiration Date:  01/04/2018  . MPN-ET/Myelofibrosis (JAK2 [id number])    Standing Status:   Future    Standing Expiration Date:   01/04/2018    All questions were answered. The Sarah Leach knows to call the clinic with any problems, questions or concerns. No barriers to learning was detected.  I spent 20 minutes counseling the Sarah Leach face to face. The total time spent in the appointment was 30 minutes and more than 50% was on counseling.     Heath Lark, MD 2/13/20186:59 AM

## 2016-11-30 NOTE — Assessment & Plan Note (Addendum)
She has completed one years worth of anticoagulation treatments. She is currently uninsured and had to pay all her medications out of pocket. She will run out of her anticoagulation therapy in 1 week. Her previous PE was unprovoked. She was found to be heterozygous for factor V Leiden mutation. I recommend her to take 325 mg daily aspirin indefinitely as secondary prevention after her prescription medication runs out In her next visit, I plan to order additional test to exclude myeloproliferative disorder as a cause of her recent DVT/PE We discussed risk factors that could be associated with increased risk of recurrence of DVT/PE  I reinforced the importance of staying active, avoiding tobacco abuse, avoiding hormonal contraceptives and to inform me if she should get pregnant for aggressive DVT prophylaxis.

## 2016-11-30 NOTE — Assessment & Plan Note (Signed)
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 I am wondering whether she could have undiagnosed myeloproliferative disorder such as essential thrombocytosis. I recommend aspirin therapy. I plan to check additional testing in her next visit.

## 2016-12-20 ENCOUNTER — Ambulatory Visit (HOSPITAL_COMMUNITY)
Admission: RE | Admit: 2016-12-20 | Discharge: 2016-12-20 | Disposition: A | Discharge status: Home / Self Care | Payer: Self-pay | Source: Ambulatory Visit | Attending: Cardiology | Admitting: Cardiology

## 2016-12-20 ENCOUNTER — Ambulatory Visit (INDEPENDENT_AMBULATORY_CARE_PROVIDER_SITE_OTHER): Payer: Self-pay | Admitting: Family Medicine

## 2016-12-20 ENCOUNTER — Telehealth: Payer: Self-pay | Admitting: *Deleted

## 2016-12-20 ENCOUNTER — Other Ambulatory Visit: Payer: Self-pay | Admitting: Family Medicine

## 2016-12-20 ENCOUNTER — Encounter (HOSPITAL_COMMUNITY): Payer: Self-pay

## 2016-12-20 VITALS — BP 124/72 | HR 80 | Temp 97.8°F | Ht 66.0 in | Wt 172.0 lb

## 2016-12-20 DIAGNOSIS — I82531 Chronic embolism and thrombosis of right popliteal vein: Secondary | ICD-10-CM | POA: Insufficient documentation

## 2016-12-20 DIAGNOSIS — D473 Essential (hemorrhagic) thrombocythemia: Secondary | ICD-10-CM

## 2016-12-20 DIAGNOSIS — D75839 Thrombocytosis, unspecified: Secondary | ICD-10-CM

## 2016-12-20 DIAGNOSIS — R52 Pain, unspecified: Secondary | ICD-10-CM

## 2016-12-20 DIAGNOSIS — Z86711 Personal history of pulmonary embolism: Secondary | ICD-10-CM

## 2016-12-20 DIAGNOSIS — R600 Localized edema: Secondary | ICD-10-CM

## 2016-12-20 DIAGNOSIS — L559 Sunburn, unspecified: Secondary | ICD-10-CM

## 2016-12-20 DIAGNOSIS — I82812 Embolism and thrombosis of superficial veins of left lower extremities: Secondary | ICD-10-CM | POA: Insufficient documentation

## 2016-12-20 DIAGNOSIS — I82409 Acute embolism and thrombosis of unspecified deep veins of unspecified lower extremity: Secondary | ICD-10-CM

## 2016-12-20 NOTE — Progress Notes (Signed)
Pre visit review using our clinic review tool, if applicable. No additional management support is needed unless otherwise documented below in the visit note. 

## 2016-12-20 NOTE — Progress Notes (Signed)
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 Preliminary results given to Dr. Maudie Mercury via Mechele Claude.

## 2016-12-20 NOTE — Telephone Encounter (Signed)
Discussed with pt. Chronic clot seen on prior. No acute clot. Will forward final report to her hematologist.

## 2016-12-20 NOTE — Patient Instructions (Addendum)
Follow up: -2 days  Go get the ultrasound  Elevated 30 minutes twice daily  Aloe.   Silvadene on blister area L foot.  Aleve or ibuprofen - hold full dose aspirin if take these several times daily.  Follow up sooner if spreading of redness, worsening, new symptoms.   WE NOW OFFER   [facility] FAST TRACK!!!  SAME DAY Appointments for ACUTE CARE  Such as: Sprains, Injuries, cuts, abrasions, rashes, muscle pain, joint pain, back pain Colds, flu, sore throats, headache, allergies, cough, fever  Ear pain, sinus and eye infections Abdominal pain, nausea, vomiting, diarrhea, upset stomach Animal/insect bites  3 Easy Ways to Schedule: Walk-In Scheduling Call in scheduling Mychart Sign-up: [email or web address]

## 2016-12-20 NOTE — Progress Notes (Signed)
HPI:  Acute visit for sunburn: -L lower extremities -took road trip to [place] last week and got sunburned -has some redness and swelling with some blistering of the skin on the feet/ankles L > R -pain in L foot and ankle -stopped xarelto a few weeks ago -no fevers, malaise, chills, nausea, vomiting, SOB  ROS: See pertinent positives and negatives per HPI.  Past Medical History:  Diagnosis Date  . Anemia    resolved, mild for some time - does give plasma twice per week which is likely etiology  . Bilateral headaches 01/16/2015   -chronic -associated with her menstrual periods   . Blood clot associated with vein wall inflammation   . Osteoarthritis of knees 01/16/2015  . Pulmonary embolism ([place]) 09/29/2015   Factor V Leiden mutation, on elequis   . Pure hypercholesterolemia 08/16/2014    Past Surgical History:  Procedure Laterality Date  . TUBAL LIGATION  2010    Family History  Problem Relation Age of Onset  . Arthritis Maternal Grandmother   . Hearing loss Maternal Grandmother   . Arthritis Maternal Grandfather   . Hearing loss Maternal Grandfather   . Clotting disorder Neg Hx     Social History   Social History  . Marital status: Married    Spouse name: N/A  . Number of children: N/A  . Years of education: N/A   Social History Main Topics  . Smoking status: Never Smoker  . Smokeless tobacco: Never Used  . Alcohol use No  . Drug use: No  . Sexual activity: Yes    Birth control/ protection: None   Other Topics Concern  . Not on file   Social History Narrative   Work or School: works at [employer] Situation: lives with husband, 4 children - none of them live with them      Spiritual Beliefs: Christian      Lifestyle: walks to work daily - 20 minutes; diet is ok - [place] [employer]           Current Outpatient Prescriptions:  .  aspirin 325 MG tablet, Take 325 mg by mouth daily., Disp: , Rfl:   EXAM:  Vitals:   12/20/16 1441  BP:  124/72  Pulse: 80  Temp: 97.8 F (36.6 C)    Body mass index is 27.76 kg/m.  GENERAL: vitals reviewed and listed above, alert, oriented, appears well hydrated and in no acute distress  HEENT: atraumatic, conjunttiva clear, no obvious abnormalities on inspection of external nose and ears  NECK: no obvious masses on inspection  LUNGS: clear to auscultation bilaterally, no wheezes, rales or rhonchi, good air movement  CV: HRRR, bilat LE edema in feet, ankles lower leges L > R, no TTP over Post calves or Deet veins, normal cap refill except mildly delayed cap refil L 2nd digit L foot  SKIN: patches of erythematous skin on bilateral dorsal feet/ankles/lower legs, peeling red skin on shoulders, some blistering skin R dorsal foot  MS: moves all extremities without noticeable abnormality  PSYCH: pleasant and cooperative, no obvious depression or anxiety  ASSESSMENT AND PLAN:  Discussed the following assessment and plan:  Sunburn - Plan: VAS [place] LOWER EXTREMITY VENOUS (DVT)  Lower extremity edema  Hx pulmonary embolism  Thrombocytosis ([facility])  -nsaids, elevation, silvadene small area L dorsal foot - I think most of the edema is sun reaction rather then infection -given degree of edema and her hx and recent long care  ride will get LE duplex to r/o DVT -close follow up/strict return and emergency precuations  -Patient advised to return or notify a doctor immediately if symptoms worsen or persist or new concerns arise.  There are no Patient Instructions on file for this visit.  Colin Benton R., DO

## 2016-12-20 NOTE — Telephone Encounter (Signed)
Katharine Look called from [facility] with a call report stating the doppler study revealed no acute DVT.  Test revealed chronic DVT right popliteal vein and superficial thrombus of the greater left saphanous vein and the pt is waiting there.  I informed Dr Maudie Mercury of this and she stated to let Katharine Look know the pt can go; however to be sure we can reach her cell number as she will contact her oncologist to see what treatment is recommended and the pt agreed to do so.

## 2016-12-23 ENCOUNTER — Ambulatory Visit (INDEPENDENT_AMBULATORY_CARE_PROVIDER_SITE_OTHER): Payer: Self-pay | Admitting: Family Medicine

## 2016-12-23 ENCOUNTER — Encounter: Payer: Self-pay | Admitting: Family Medicine

## 2016-12-23 VITALS — BP 100/60 | HR 55 | Temp 98.7°F | Ht 66.0 in | Wt 172.0 lb

## 2016-12-23 DIAGNOSIS — Z86711 Personal history of pulmonary embolism: Secondary | ICD-10-CM

## 2016-12-23 DIAGNOSIS — D75839 Thrombocytosis, unspecified: Secondary | ICD-10-CM

## 2016-12-23 DIAGNOSIS — D473 Essential (hemorrhagic) thrombocythemia: Secondary | ICD-10-CM

## 2016-12-23 DIAGNOSIS — L559 Sunburn, unspecified: Secondary | ICD-10-CM

## 2016-12-23 DIAGNOSIS — R600 Localized edema: Secondary | ICD-10-CM

## 2016-12-23 DIAGNOSIS — D6851 Activated protein C resistance: Secondary | ICD-10-CM

## 2016-12-23 NOTE — Patient Instructions (Signed)
BEFORE YOU LEAVE: -follow up: Physical in November and as needed  You could stop the anti-inflammatory medications now and continue your aspirin.  You can continue  to use the cream as needed for a few more days.  Please avoid sun exposure in the future, cover up with clothing and use a good sunscreen applied very liberally. Reapply sunscreen every several hours and if you are in the water.

## 2016-12-23 NOTE — Progress Notes (Signed)
Pre visit review using our clinic review tool, if applicable. No additional management support is needed unless otherwise documented below in the visit note. 

## 2016-12-23 NOTE — Progress Notes (Signed)
HPI:  Sarah Leach is here for follow-up for sunburn, history of clot and swelling in the lower extremities. She has seen dramatic improvement. The swelling is completely resolved, redness is resolving and there is no longer discomfort. Her ultrasound only showed old clot, no new clot burden. She sees her oncologist for her history of thromboembolism. Denies fevers, drainage, wounds in the skin, shortness of breath or any further swelling.  ROS: See pertinent positives and negatives per HPI.  Past Medical History:  Diagnosis Date  . Anemia    resolved, mild for some time - does give plasma twice per week which is likely etiology  . Bilateral headaches 01/16/2015   -chronic -associated with her menstrual periods   . Blood clot associated with vein wall inflammation   . Osteoarthritis of knees 01/16/2015  . Pulmonary embolism ([place]) 09/29/2015   Factor V Leiden mutation, on elequis   . Pure hypercholesterolemia 08/16/2014    Past Surgical History:  Procedure Laterality Date  . TUBAL LIGATION  2010    Family History  Problem Relation Age of Onset  . Arthritis Maternal Grandmother   . Hearing loss Maternal Grandmother   . Arthritis Maternal Grandfather   . Hearing loss Maternal Grandfather   . Clotting disorder Neg Hx     Social History   Social History  . Marital status: Married    Spouse name: N/A  . Number of children: N/A  . Years of education: N/A   Social History Main Topics  . Smoking status: Never Smoker  . Smokeless tobacco: Never Used  . Alcohol use No  . Drug use: No  . Sexual activity: Yes    Birth control/ protection: None   Other Topics Concern  . None   Social History Narrative   Work or School: works at [employer] Situation: lives with husband, 4 children - none of them live with them      Spiritual Beliefs: Christian      Lifestyle: walks to work daily - 20 minutes; diet is ok - [place] lot [place] [employer]           Current Outpatient  Prescriptions:  .  aspirin 325 MG tablet, Take 325 mg by mouth daily., Disp: , Rfl:  .  IBUPROFEN PO, Take by mouth., Disp: , Rfl:   EXAM:  Vitals:   12/23/16 0933  BP: 100/60  Pulse: (!) 55  Temp: 98.7 F (37.1 C)    Body mass index is 27.76 kg/m.  GENERAL: vitals reviewed and listed above, alert, oriented, appears well hydrated and in no acute distress  HEENT: atraumatic, conjunttiva clear, no obvious abnormalities on inspection of external nose and ears  NECK: no obvious masses on inspection  LUNGS: clear to auscultation bilaterally, no wheezes, rales or rhonchi, good air movement  CV: HRRR, no peripheral edema  SKIN: remarkable improvement in the swelling and redness skin in the lower extremities, several very small streaky patches of erythema, no skin breakdown/blisters or wounds, no signs of infection   MS: moves all extremities without noticeable abnormality  PSYCH: pleasant and cooperative, no obvious depression or anxiety  ASSESSMENT AND PLAN:  Discussed the following assessment and plan:  Sunburn  Leg edema  Thrombocytosis (HCC)  Factor V Leiden (HCC)  Hx pulmonary embolism  -wow, remarkable improvement complete resolution of the swelling and skin redness and inflammation resolving  -No new clot on ultrasound, will forward find reason for to her hematology -Continue treatment  per hematology recommendations with aspirin and stop the NSAIDs today, and she needed the Silvadene for a few more days as needed on the red areas  -Strict sun exposure precautions for future  -Patient advised to return or notify a doctor immediately if symptoms worsen or persist or new concerns arise.  Patient Instructions  BEFORE YOU LEAVE: -follow up: Physical in November and as needed  You could stop the anti-inflammatory medications now and continue your aspirin.  You can continue  to use the cream as needed for a few more days.  Please avoid sun exposure in the future,  cover up with clothing and use a good sunscreen applied very liberally. Reapply sunscreen every several hours and if you are in the water.    Colin Benton R., DO

## 2017-03-04 ENCOUNTER — Ambulatory Visit: Payer: Self-pay | Admitting: Family Medicine

## 2017-03-12 IMAGING — DX DG SHOULDER 2+V*L*
3 series · 3 of 3 positions shown · non-contrast
Comparison: [facility] chest x-ray December 16, 2015 which included the
left shoulder.

CLINICAL DATA: Severe pain with limited range of motion since
popping the shoulder while asleep two nights ago.

EXAM:
LEFT SHOULDER - 2+ VIEW

[grashey]
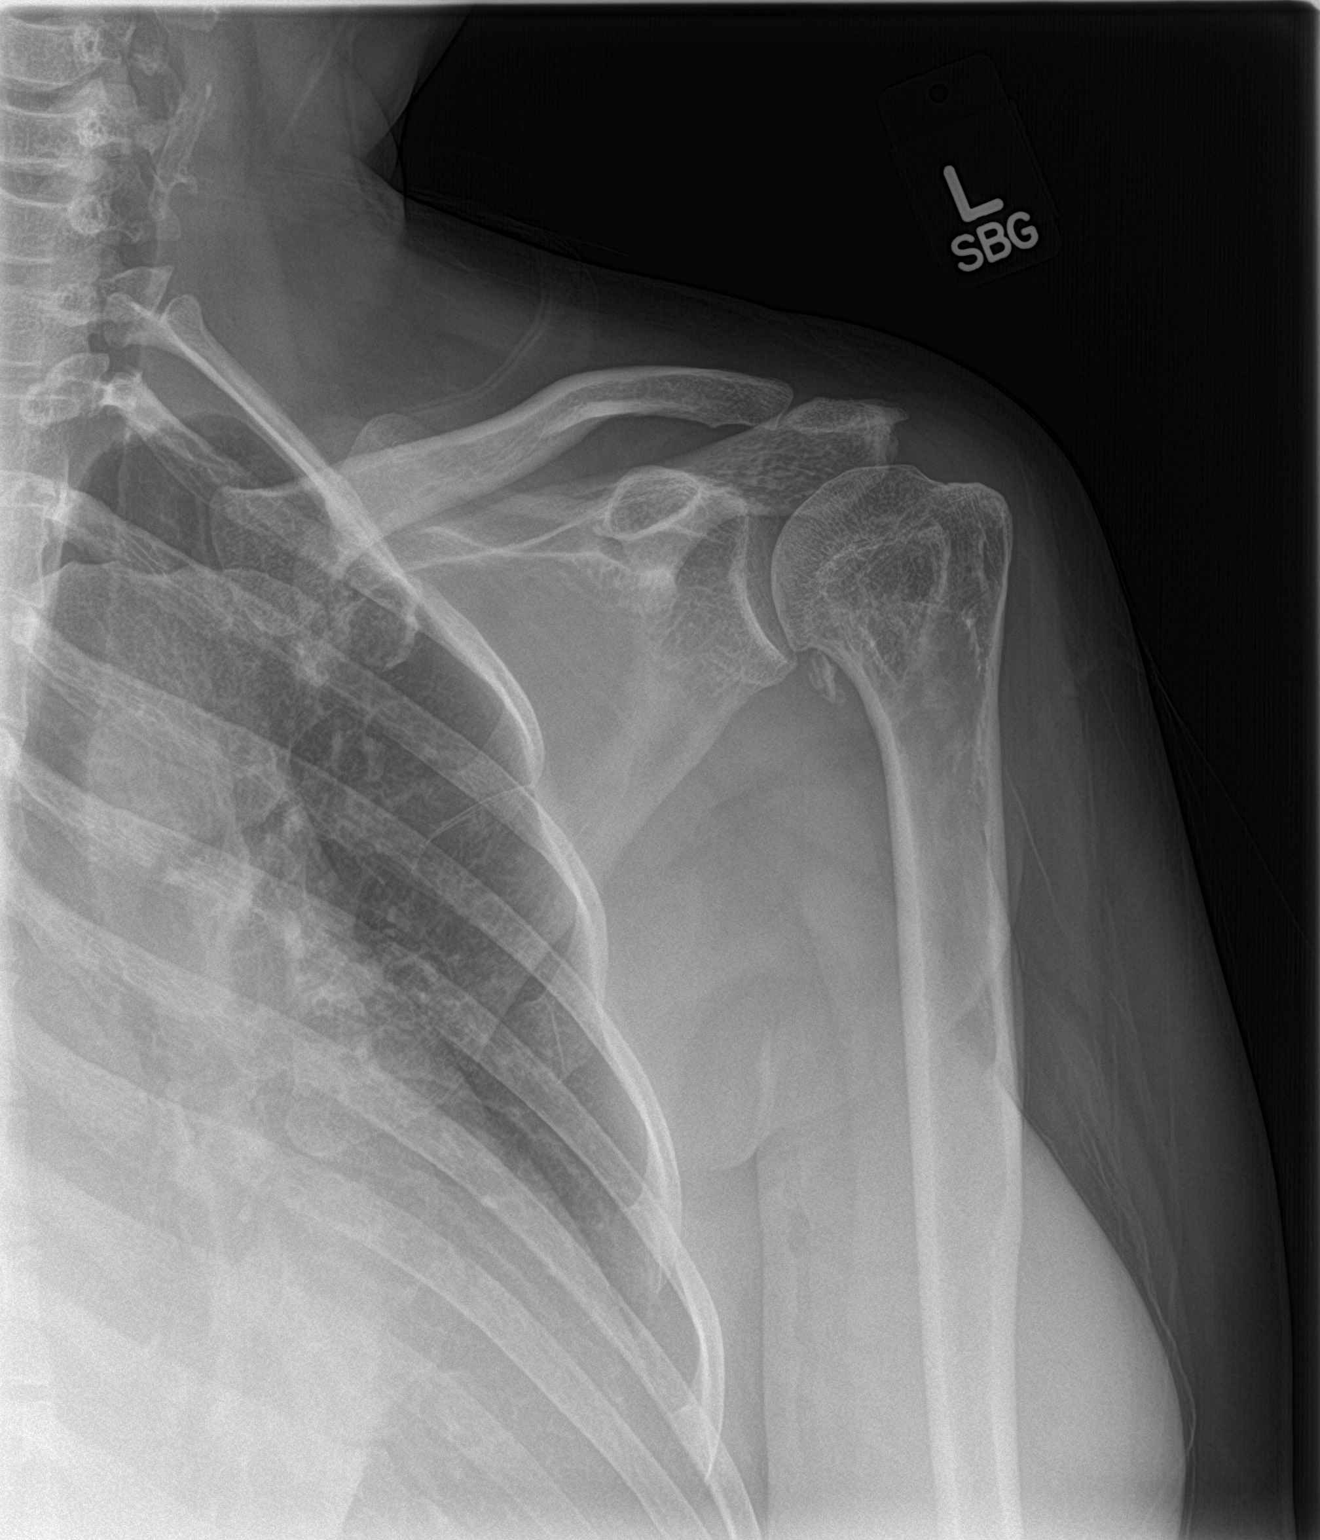

[y view]
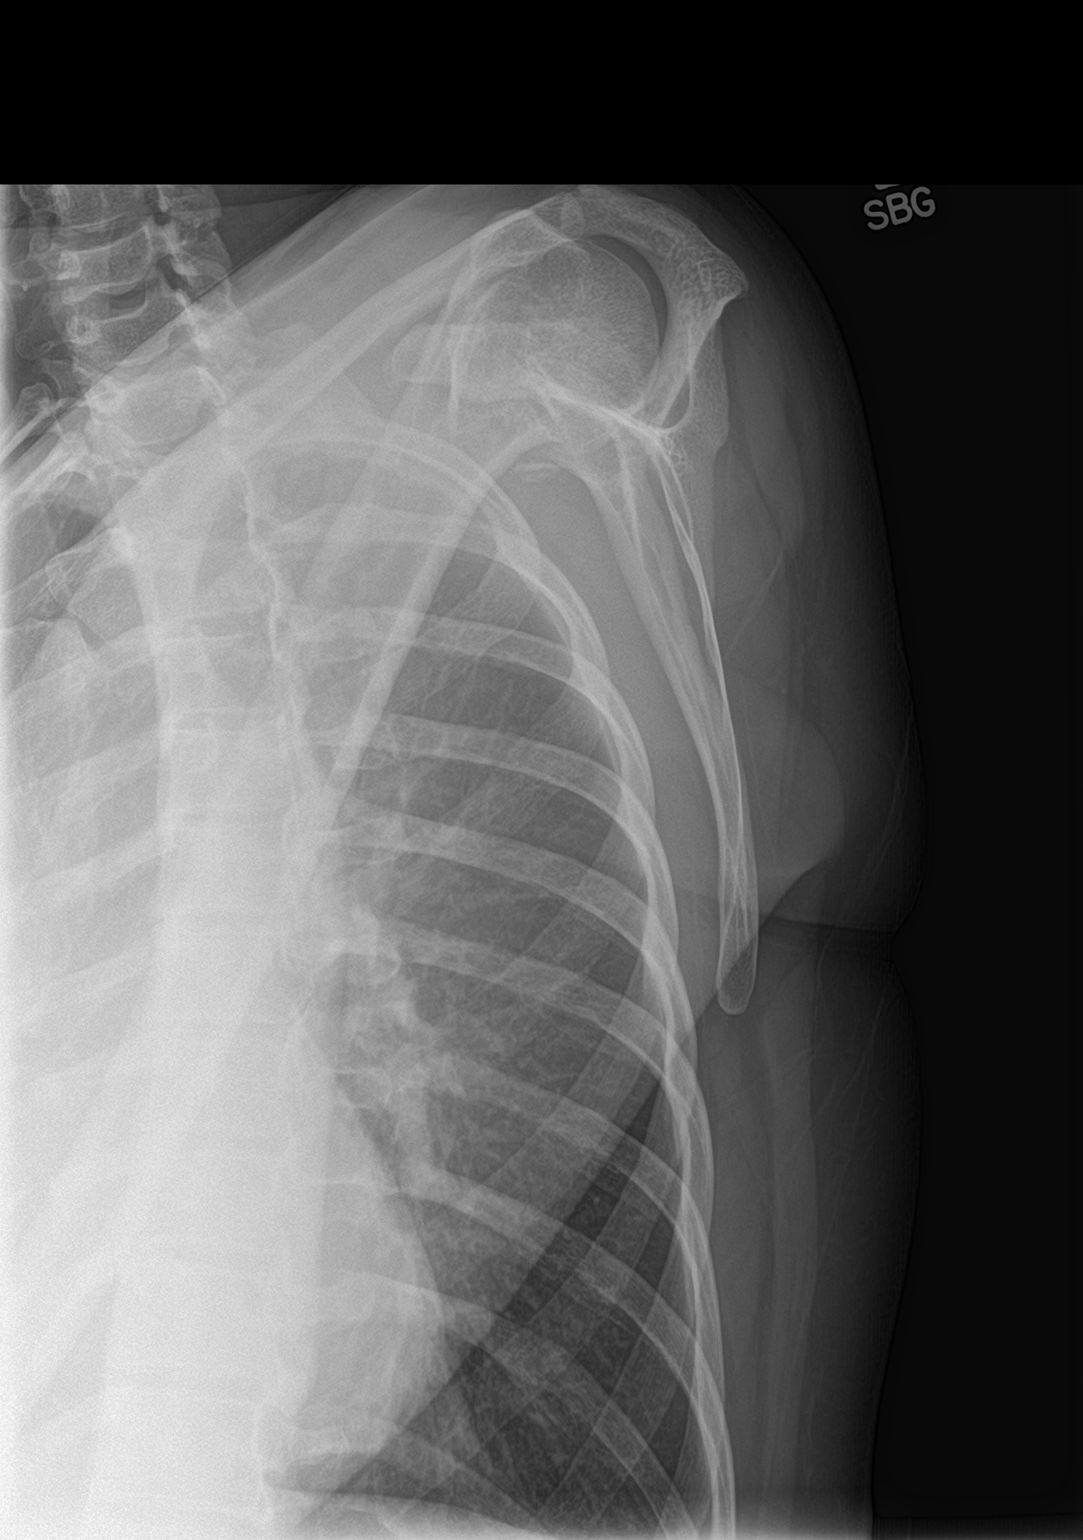

[shoulder axial]
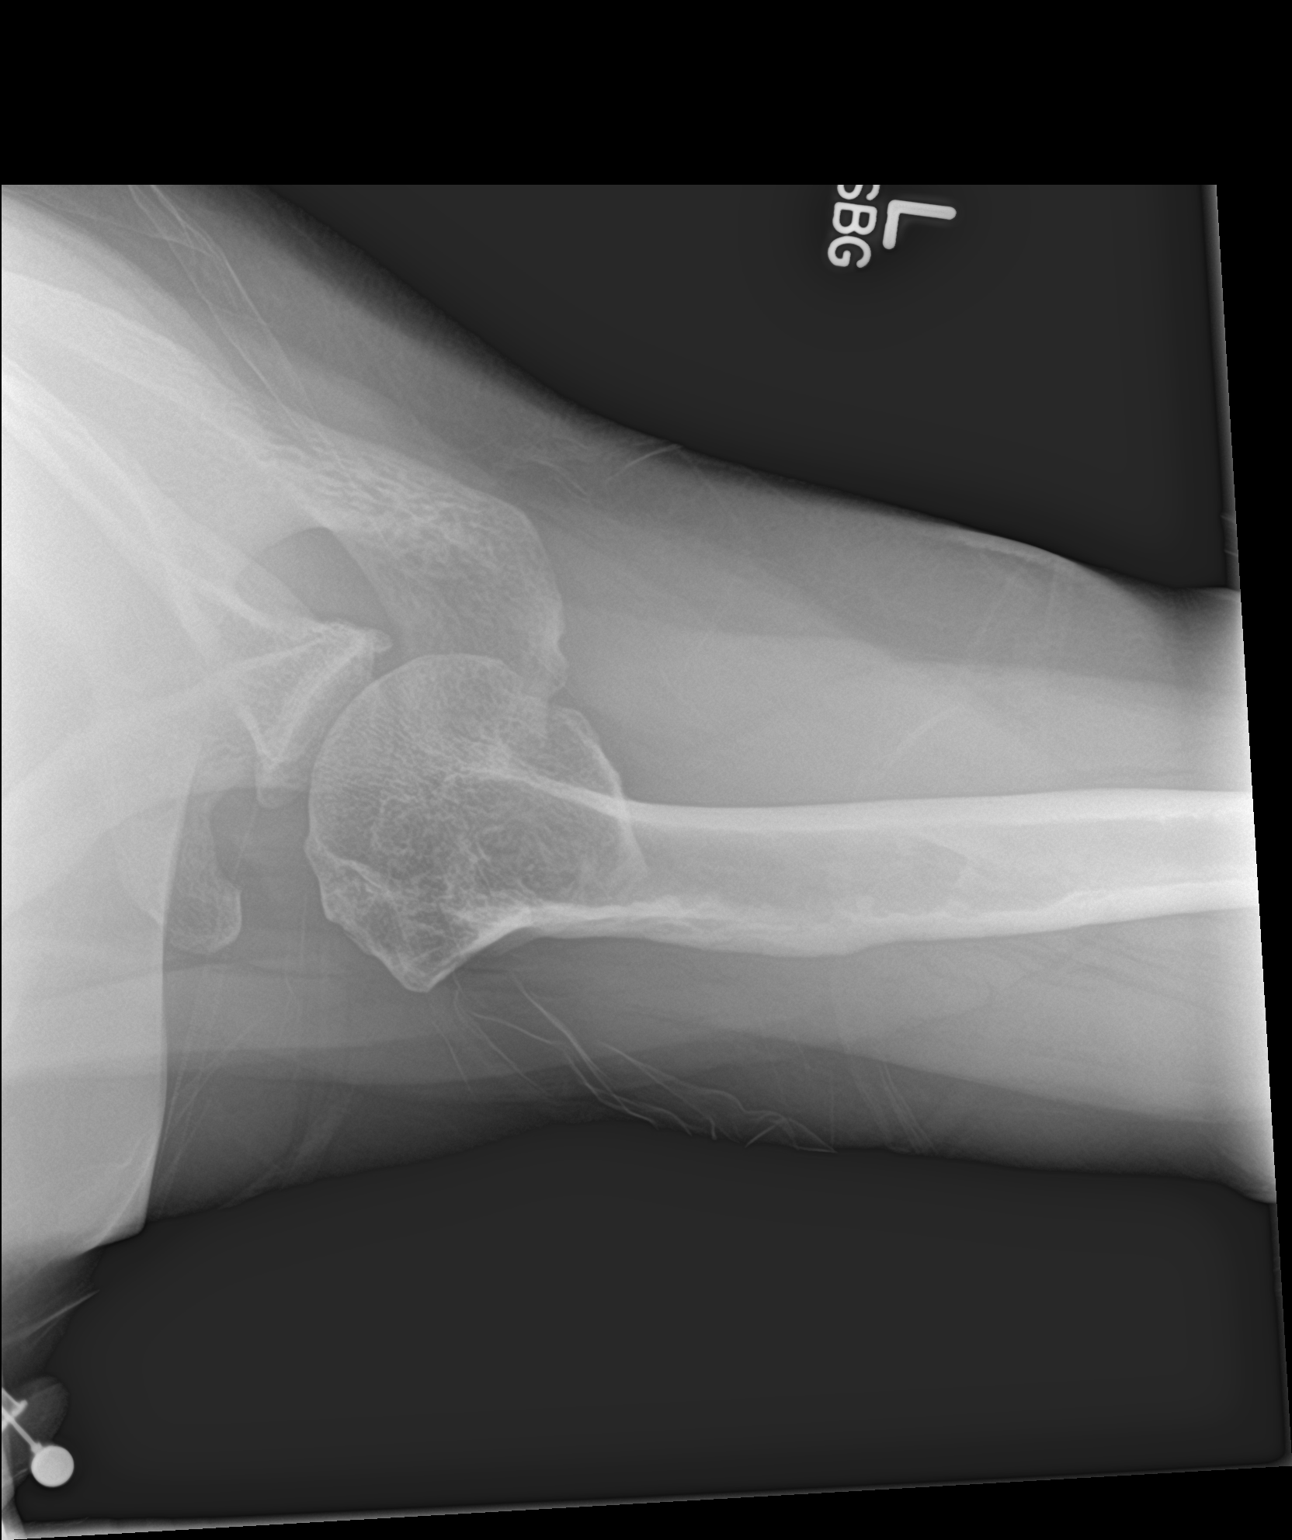

[3 of 3 positions shown; findings below may reference images not displayed]

FINDINGS: The bones are subjectively osteopenic. There is soft tissue
calcification noted inferior to the humeral head and just lateral to
the glenoid. The glenohumeral joint space appears reasonably
well-maintained. There are marginal osteophytes associated with the
glenoid however. There is a small subacromial spur. The observed
portions of the left clavicle and the AC joint exhibit no
significant abnormalities.
IMPRESSION: Soft tissue calcifications likely reflect tendinosis. Mild
osteoarthritic spurring of the glenohumeral joint. Narrowing of the
subacromial subdeltoid space. These findings are consistent with
degenerative change. No definite acute fracture.

MRI of the shoulder would be useful if the patient's symptoms
persist.

## 2017-07-07 ENCOUNTER — Encounter: Payer: Self-pay | Admitting: Family Medicine

## 2017-07-29 ENCOUNTER — Other Ambulatory Visit (HOSPITAL_BASED_OUTPATIENT_CLINIC_OR_DEPARTMENT_OTHER): Payer: Self-pay

## 2017-07-29 ENCOUNTER — Encounter: Payer: Self-pay | Admitting: Hematology and Oncology

## 2017-07-29 ENCOUNTER — Ambulatory Visit (HOSPITAL_BASED_OUTPATIENT_CLINIC_OR_DEPARTMENT_OTHER): Payer: Self-pay | Admitting: Hematology and Oncology

## 2017-07-29 DIAGNOSIS — D6851 Activated protein C resistance: Secondary | ICD-10-CM

## 2017-07-29 DIAGNOSIS — D473 Essential (hemorrhagic) thrombocythemia: Secondary | ICD-10-CM

## 2017-07-29 DIAGNOSIS — Z86711 Personal history of pulmonary embolism: Secondary | ICD-10-CM

## 2017-07-29 DIAGNOSIS — Z86718 Personal history of other venous thrombosis and embolism: Secondary | ICD-10-CM

## 2017-07-29 DIAGNOSIS — Z7982 Long term (current) use of aspirin: Secondary | ICD-10-CM

## 2017-07-29 DIAGNOSIS — D75839 Thrombocytosis, unspecified: Secondary | ICD-10-CM

## 2017-07-29 DIAGNOSIS — K089 Disorder of teeth and supporting structures, unspecified: Secondary | ICD-10-CM

## 2017-07-29 LAB — FERRITIN: Ferritin: 29 ng/ml (ref 9–269)

## 2017-07-29 LAB — COMPREHENSIVE METABOLIC PANEL
ALT: 8 U/L (ref 0–55)
AST: 13 U/L (ref 5–34)
Albumin: 3.9 g/dL (ref 3.5–5.0)
Alkaline Phosphatase: 62 U/L (ref 40–150)
Anion Gap: 8 mEq/L (ref 3–11)
BUN: 9.5 mg/dL (ref 7.0–26.0)
CO2: 25 meq/L (ref 22–29)
Calcium: 9.1 mg/dL (ref 8.4–10.4)
Chloride: 108 mEq/L (ref 98–109)
Creatinine: 0.9 mg/dL (ref 0.6–1.1)
GLUCOSE: 94 mg/dL (ref 70–140)
Potassium: 3.9 mEq/L (ref 3.5–5.1)
SODIUM: 141 meq/L (ref 136–145)
TOTAL PROTEIN: 7.7 g/dL (ref 6.4–8.3)
Total Bilirubin: 0.37 mg/dL (ref 0.20–1.20)

## 2017-07-29 LAB — CBC WITH DIFFERENTIAL/PLATELET
BASO%: 0.7 % (ref 0.0–2.0)
Basophils Absolute: 0 10*3/uL (ref 0.0–0.1)
EOS ABS: 0.1 10*3/uL (ref 0.0–0.5)
EOS%: 1.4 % (ref 0.0–7.0)
HCT: 38.3 % (ref 34.8–46.6)
HEMOGLOBIN: 13 g/dL (ref 11.6–15.9)
LYMPH%: 27.4 % (ref 14.0–49.7)
MCH: 29.2 pg (ref 25.1–34.0)
MCHC: 34 g/dL (ref 31.5–36.0)
MCV: 85.9 fL (ref 79.5–101.0)
MONO#: 0.5 10*3/uL (ref 0.1–0.9)
MONO%: 7.6 % (ref 0.0–14.0)
NEUT%: 62.9 % (ref 38.4–76.8)
NEUTROS ABS: 3.9 10*3/uL (ref 1.5–6.5)
Platelets: 466 10*3/uL — ABNORMAL HIGH (ref 145–400)
RBC: 4.46 10*6/uL (ref 3.70–5.45)
RDW: 14.1 % (ref 11.2–14.5)
WBC: 6.2 10*3/uL (ref 3.9–10.3)
lymph#: 1.7 10*3/uL (ref 0.9–3.3)

## 2017-07-29 NOTE — Assessment & Plan Note (Signed)
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 I am wondering whether she could have undiagnosed myeloproliferative disorder such as essential thrombocytosis. I recommend aspirin therapy. It could be reactive from poor dentition I will call the patient once I have results from additional workup to exclude myeloproliferative disorder

## 2017-07-29 NOTE — Assessment & Plan Note (Signed)
She has completed more than one year of anticoagulation treatments. Her previous PE was unprovoked. She was found to be heterozygous for factor V Leiden mutation. I recommend her to take 325 mg daily aspirin indefinitely as secondary prevention She cannot afford NOAC I reinforced the importance of staying active, avoiding tobacco abuse, avoiding hormonal contraceptives and to inform me if she should get pregnant for aggressive DVT prophylaxis.

## 2017-07-29 NOTE — Progress Notes (Signed)
[facility] OFFICE PROGRESS NOTE  Sarah Kern, DO SUMMARY OF HEMATOLOGIC HISTORY:  She was hospitalized between 09/29/2015 to 10/03/2015 after presentation with pleuritic chest pain and progressive dyspnea. She underwent extensive evaluation and was found to have significant right lower extremity DVT and PE. She underwent anticoagulation therapy and was subsequently discharged on eloquence. Since hospitalization, pleuritic chest pain is improving. She denies difficulties with breathing or hemoptysis. She denies bleeding complication from Eliquis. Prior to admission, she denies recent history of trauma, long distance travel, dehydration, recent surgery, smoking or prolonged immobilization. She had no prior history or diagnosis of cancer. Her age appropriate screening programs are up-to-date. She had prior surgeries before and never had perioperative thromboembolic events. The patient had been exposed to birth control pills and never had thrombotic events. The patient had been pregnant before and denies history of peripartum thromboembolic event or history of recurrent miscarriages. There is no family history of blood clots or miscarriages. She went to the emergency department recently and have repeat ultrasound venous Doppler on 12/17/2015. 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 and peroneal veins of the right lower extremity  INTERVAL HISTORY: Sarah Leach 36 y.o. female returns for further follow-up She tolerated aspirin well except for occasional reflux The patient denies any recent signs or symptoms of bleeding such as spontaneous epistaxis, hematuria or hematochezia. She denies recent infection but does have poor dentition She has not seen a dentist due to lack of insurance  I have reviewed the past medical history, past surgical history, social history and family history with the patient  and they are unchanged from previous note.  ALLERGIES:  has No Known Allergies.  MEDICATIONS:  Current Outpatient Prescriptions  Medication Sig Dispense Refill  . aspirin 325 MG tablet Take 325 mg by mouth daily.    . IBUPROFEN PO Take by mouth.     No current facility-administered medications for this visit.      REVIEW OF SYSTEMS:   Constitutional: Denies fevers, chills or night sweats Eyes: Denies blurriness of vision Ears, nose, mouth, throat, and face: Denies mucositis or sore throat Respiratory: Denies cough, dyspnea or wheezes Cardiovascular: Denies palpitation, chest discomfort or lower extremity swelling Gastrointestinal:  Denies nausea, heartburn or change in bowel habits Skin: Denies abnormal skin rashes Lymphatics: Denies new lymphadenopathy or easy bruising Neurological:Denies numbness, tingling or new weaknesses Behavioral/Psych: Mood is stable, no new changes  All other systems were reviewed with the patient and are negative.  PHYSICAL EXAMINATION: ECOG PERFORMANCE STATUS: 1 - Symptomatic but completely ambulatory  Vitals:   07/29/17 1022  BP: 138/73  Pulse: (!) 59  Resp: 18  Temp: 98.2 F (36.8 C)  SpO2: 98%   Filed Weights   07/29/17 1022  Weight: 176 lb (79.8 kg)    GENERAL:alert, no distress and comfortable SKIN: skin color, texture, turgor are normal, no rashes or significant lesions EYES: normal, Conjunctiva are pink and non-injected, sclera clear OROPHARYNX:no exudate, no erythema and lips, buccal mucosa, and tongue normal  NECK: supple, thyroid normal size, non-tender, without nodularity LYMPH:  no palpable lymphadenopathy in the cervical, axillary or inguinal LUNGS: clear to auscultation and percussion with normal breathing effort HEART: regular rate & rhythm and no murmurs and no lower extremity edema ABDOMEN:abdomen soft, non-tender and normal bowel sounds Musculoskeletal:no cyanosis of digits and no clubbing  NEURO: alert & oriented x 3  with fluent speech, no focal motor/sensory deficits  LABORATORY DATA:  I have reviewed the data as listed     Component Value Date/Time   NA 141 07/29/2017 1005   K 3.9 07/29/2017 1005   CL 105 09/03/2016 1004   CO2 25 07/29/2017 1005   GLUCOSE 94 07/29/2017 1005   BUN 9.5 07/29/2017 1005   CREATININE 0.9 07/29/2017 1005   CALCIUM 9.1 07/29/2017 1005   PROT 7.7 07/29/2017 1005   ALBUMIN 3.9 07/29/2017 1005   AST 13 07/29/2017 1005   ALT 8 07/29/2017 1005   ALKPHOS 62 07/29/2017 1005   BILITOT 0.37 07/29/2017 1005   GFRNONAA >60 12/17/2015 0845   GFRAA >60 12/17/2015 0845    No results found for: SPEP, UPEP  Lab Results  Component Value Date   WBC 6.2 07/29/2017   NEUTROABS 3.9 07/29/2017   HGB 13.0 07/29/2017   HCT 38.3 07/29/2017   MCV 85.9 07/29/2017   PLT 466 (H) 07/29/2017      Chemistry      Component Value Date/Time   NA 141 07/29/2017 1005   K 3.9 07/29/2017 1005   CL 105 09/03/2016 1004   CO2 25 07/29/2017 1005   BUN 9.5 07/29/2017 1005   CREATININE 0.9 07/29/2017 1005      Component Value Date/Time   CALCIUM 9.1 07/29/2017 1005   ALKPHOS 62 07/29/2017 1005   AST 13 07/29/2017 1005   ALT 8 07/29/2017 1005   BILITOT 0.37 07/29/2017 1005      ASSESSMENT & PLAN:  Hx pulmonary embolism She has completed more than one year of anticoagulation treatments. Her previous PE was unprovoked. She was found to be heterozygous for factor V Leiden mutation. I recommend her to take 325 mg daily aspirin indefinitely as secondary prevention She cannot afford NOAC I reinforced the importance of staying active, avoiding tobacco abuse, avoiding hormonal contraceptives and to inform me if she should get pregnant for aggressive DVT prophylaxis.  Thrombocytosis ([place]) She is noted to have chronic thrombocytosis. I am wondering whether she could have undiagnosed myeloproliferative disorder such as essential thrombocytosis. I recommend aspirin therapy. It could be  reactive from poor dentition I will call the patient once I have results from additional workup to exclude myeloproliferative disorder  Poor dentition She has poor dentition but does not have dental insurance I recommend she contact her primary care doctor for local resources    No orders of the defined types were placed in this encounter.   All questions were answered. The patient knows to call the clinic with any problems, questions or concerns. No barriers to learning was detected.  I spent 10 minutes counseling the patient face to face. The total time spent in the appointment was 15 minutes and more than 50% was on counseling.     Heath Lark, MD 10/12/201811:31 AM

## 2017-07-29 NOTE — Assessment & Plan Note (Signed)
She has poor dentition but does not have dental insurance I recommend she contact her primary care doctor for local resources

## 2017-08-01 ENCOUNTER — Other Ambulatory Visit: Payer: Self-pay | Admitting: Hematology and Oncology

## 2017-08-01 DIAGNOSIS — D75839 Thrombocytosis, unspecified: Secondary | ICD-10-CM

## 2017-08-01 DIAGNOSIS — D473 Essential (hemorrhagic) thrombocythemia: Secondary | ICD-10-CM

## 2017-08-01 LAB — SEDIMENTATION RATE: Sedimentation Rate-Westergren: 9 mm/hr (ref 0–32)

## 2017-08-04 ENCOUNTER — Telehealth: Payer: Self-pay

## 2017-08-04 NOTE — Telephone Encounter (Signed)
Called per Dr. Alvy Bimler, all lab results are normal. No need for follow up. Verbalzied understanding.

## 2017-08-12 LAB — MPN-ET/MYELOFIBROSIS (JAK2 V617F-MPL515-CALR)

## 2017-09-05 NOTE — Progress Notes (Signed)
HPI:  Here for CPE: Due for flu shot and labs. -Concerns and/or follow up today: History of pulmonary embolus - Seeing Dr. Alvy Bimler for management.  Heterozygous for factor V Leiden mutation.  They have decided to do higher dose aspirin daily for secondary prevention rather than oral anticoagulation at this time.  He did extensive lab work with the hematologist recently.   She has a new complaint of depressed mood.  She is not sure why.  She reports her partner is very kind and supportive.  Symptoms include anhedonia, feeling down on most days, sadness, crying and a few panic attacks.  She denies manic symptoms, thoughts of self-harm, suicidal ideation and hallucinations.  No regular exercise.  She is stressed about not having a job.  She is currently looking for work.  -Diet: variety of foods, balance and well rounded, larger portion sizes -Exercise: no regular exercise -Taking folic acid, vitamin D or calcium: no -Diabetes and Dyslipidemia Screening: She did screening last year, very mildly elevated cholesterol and prediabetes -prefers not to do labs today -Vaccines: see vaccine section EPIC -pap history: 08/2016, normal, HPV negative -FDLMP: see nursing notes -sexual activity: yes, female partner, no new partners -wants STI testing (Hep C if born 41-65): no -FH breast, colon or ovarian ca: see FH Last mammogram: Not applicable Last colon cancer screening: Not applicable Breast Ca Risk Assessment: see family history and pt history DEXA (>/= 65): Not applicable  -Alcohol, Tobacco, drug use: see social history  Review of Systems - no fevers, unintentional weight loss, vision loss, hearing loss, chest pain, sob, hemoptysis, melena, hematochezia, hematuria, genital discharge, changing or concerning skin lesions, bleeding, bruising, loc, thoughts of self harm or SI  Past Medical History:  Diagnosis Date  . Anemia    resolved, mild for some time - does give plasma twice per week which is  likely etiology  . Bilateral headaches 01/16/2015   -chronic -associated with her menstrual periods   . Blood clot associated with vein wall inflammation   . Osteoarthritis of knees 01/16/2015  . Pulmonary embolism ([place]) 09/29/2015   Factor V Leiden mutation, on elequis   . Pure hypercholesterolemia 08/16/2014    Past Surgical History:  Procedure Laterality Date  . TUBAL LIGATION  2010    Family History  Problem Relation Age of Onset  . Arthritis Maternal Grandmother   . Hearing loss Maternal Grandmother   . Arthritis Maternal Grandfather   . Hearing loss Maternal Grandfather   . Clotting disorder Neg Hx     Social History   Socioeconomic History  . Marital status: Married    Spouse name: None  . Number of children: None  . Years of education: None  . Highest education level: None  Social Needs  . Financial resource strain: None  . Food insecurity - worry: None  . Food insecurity - inability: None  . Transportation needs - medical: None  . Transportation needs - non-medical: None  Occupational History  . None  Tobacco Use  . Smoking status: Never Smoker  . Smokeless tobacco: Never Used  Substance and Sexual Activity  . Alcohol use: No  . Drug use: No  . Sexual activity: Yes    Birth control/protection: None  Other Topics Concern  . None  Social History Narrative   Work or School: works at [employer] Situation: lives with husband, 4 children - none of them live with them      Spiritual Beliefs: Darrick Meigs  Lifestyle: walks to work daily - 20 minutes; diet is ok - eats a lot at [employer]        Current Outpatient Medications:  .  aspirin 325 MG tablet, Take 325 mg by mouth daily., Disp: , Rfl:  .  IBUPROFEN PO, Take as needed by mouth. , Disp: , Rfl:   EXAM:  Vitals:   09/06/17 0811  BP: 116/70  Pulse: 67  Temp: 97.8 F (36.6 C)    GENERAL: vitals reviewed and listed below, alert, oriented, appears well hydrated and in no acute  distress  HEENT: head atraumatic, PERRLA, normal appearance of eyes, ears, nose and mouth. moist mucus membranes.  NECK: supple, no masses or lymphadenopathy  LUNGS: clear to auscultation bilaterally, no rales, rhonchi or wheeze  CV: HRRR, no peripheral edema or cyanosis, normal pedal pulses  ABDOMEN: bowel sounds normal, soft, non tender to palpation, no masses, no rebound or guarding  GU/BREAST: Declined  SKIN: no rash or abnormal lesions  MS: normal gait, moves all extremities normally  NEURO: normal gait, speech and thought processing grossly intact, muscle tone grossly intact throughout  PSYCH: normal affect, pleasant and cooperative  ASSESSMENT AND PLAN:  Discussed the following assessment and plan:  1. PREVENTIVE EXAM: -Discussed and advised all [place] preventive services health task force level A and B recommendations for age, sex and risks. -Advised at least 150 minutes of exercise per week and a healthy diet with avoidance of (less then 1 serving per week) processed foods, white starches, red meat, fast foods and sweets and consisting of: * 5-9 servings of fresh fruits and vegetables (not corn or potatoes) *nuts and seeds, beans *olives and olive oil *lean meats such as fish and white chicken  *whole grains -labs, studies and vaccines per orders this encounter -she declined labs  2. Moderate episode of recurrent major depressive disorder ([place]) -Discussed options, she prefers to start with non-pharmacological interventions -Verbal contract for safety and she is to notify doctor immediately if worsening -Follow-up in 1 month  3. Screening for depression -Positive screening  4. Need for immunization against influenza - Flu Vaccine QUAD 6+ mos PF IM (Fluarix Quad PF)  5. Factor V Leiden [facility]) -Sees hematology for management   Patient advised to return to clinic immediately if symptoms worsen or persist or new concerns.  Patient Instructions  BEFORE YOU  LEAVE: -flu shot -follow up: 1 month  Get outside and take a walk every day.  Consider Cognitive behavioral therapy through app or your church or the brochure I provided Follow up sooner if any worsening of symptoms  We recommend the following healthy lifestyle for LIFE: 1) Small portions. But, make sure to get regular (at least 3 per day), healthy meals and small healthy snacks if needed.  2) Eat a healthy clean diet.   TRY TO EAT: -at least 5-7 servings of low sugar, colorful, and nutrient rich vegetables per day (not corn, potatoes or bananas.) -berries are the best choice if you wish to eat fruit (only eat small amounts if trying to reduce weight)  -lean meets (fish, white meat of chicken or [place]) -vegan proteins for some meals - beans or tofu, whole grains, nuts and seeds -Replace bad fats with good fats - good fats include: fish, nuts and seeds, canola oil, olive oil -small amounts of low fat or non fat dairy -small amounts of100 % whole grains - check the lables -drink plenty of water  AVOID: -SUGAR, sweets, anything with added sugar,  corn syrup or sweeteners - must read labels as even foods advertised as "healthy" often are loaded with sugar -if you must have a sweetener, small amounts of stevia may be best -sweetened beverages and artificially sweetened beverages -simple starches (rice, bread, potatoes, pasta, chips, etc - small amounts of 100% whole grains are ok) -red meat, pork, butter -fried foods, fast food, processed food, excessive dairy, eggs and coconut.  3)Get at least 150 minutes of sweaty aerobic exercise per week.  4)Reduce stress - consider counseling, meditation and relaxation to balance other aspects of your life.     No Follow-up on file.  Colin Benton R., DO

## 2017-09-06 ENCOUNTER — Ambulatory Visit (INDEPENDENT_AMBULATORY_CARE_PROVIDER_SITE_OTHER): Payer: Self-pay | Admitting: Family Medicine

## 2017-09-06 ENCOUNTER — Encounter: Payer: Self-pay | Admitting: Family Medicine

## 2017-09-06 VITALS — BP 116/70 | HR 67 | Temp 97.8°F | Ht 66.0 in | Wt 177.2 lb

## 2017-09-06 DIAGNOSIS — Z23 Encounter for immunization: Secondary | ICD-10-CM

## 2017-09-06 DIAGNOSIS — D6851 Activated protein C resistance: Secondary | ICD-10-CM

## 2017-09-06 DIAGNOSIS — Z0000 Encounter for general adult medical examination without abnormal findings: Secondary | ICD-10-CM

## 2017-09-06 DIAGNOSIS — I2609 Other pulmonary embolism with acute cor pulmonale: Secondary | ICD-10-CM | POA: Insufficient documentation

## 2017-09-06 DIAGNOSIS — Z1331 Encounter for screening for depression: Secondary | ICD-10-CM

## 2017-09-06 DIAGNOSIS — F331 Major depressive disorder, recurrent, moderate: Secondary | ICD-10-CM

## 2017-09-06 NOTE — Patient Instructions (Signed)
BEFORE YOU LEAVE: -flu shot -follow up: 1 month  Get outside and take a walk every day.  Consider Cognitive behavioral therapy through app or your church or the brochure I provided Follow up sooner if any worsening of symptoms  We recommend the following healthy lifestyle for LIFE: 1) Small portions. But, make sure to get regular (at least 3 per day), healthy meals and small healthy snacks if needed.  2) Eat a healthy clean diet.   TRY TO EAT: -at least 5-7 servings of low sugar, colorful, and nutrient rich vegetables per day (not corn, potatoes or bananas.) -berries are the best choice if you wish to eat fruit (only eat small amounts if trying to reduce weight)  -lean meets (fish, white meat of chicken or [place]) -vegan proteins for some meals - beans or tofu, whole grains, nuts and seeds -Replace bad fats with good fats - good fats include: fish, nuts and seeds, canola oil, olive oil -small amounts of low fat or non fat dairy -small amounts of100 % whole grains - check the lables -drink plenty of water  AVOID: -SUGAR, sweets, anything with added sugar, corn syrup or sweeteners - must read labels as even foods advertised as "healthy" often are loaded with sugar -if you must have a sweetener, small amounts of stevia may be best -sweetened beverages and artificially sweetened beverages -simple starches (rice, bread, potatoes, pasta, chips, etc - small amounts of 100% whole grains are ok) -red meat, pork, butter -fried foods, fast food, processed food, excessive dairy, eggs and coconut.  3)Get at least 150 minutes of sweaty aerobic exercise per week.  4)Reduce stress - consider counseling, meditation and relaxation to balance other aspects of your life.

## 2017-10-04 NOTE — Progress Notes (Signed)
HPI:  Follow-up major depressive disorder: -Recurrent -At her last visit she wanted to try nonpharmacological interventions first -Reports: -Saw a counselor at church and this really helped, however the church counselor does this out of the goodness of her heart and is not available again until January -Sleep is poor, she has a lack of motivation -Wants to try medication -Primary concern is asleep, but wants something to treat sleep and depression -Denies suicidal ideation, thoughts of self-harm, hallucinations, manic symptoms or anxiety  ROS: See pertinent positives and negatives per HPI.  Past Medical History:  Diagnosis Date  . Anemia    resolved, mild for some time - does give plasma twice per week which is likely etiology  . Bilateral headaches 01/16/2015   -chronic -associated with her menstrual periods   . Blood clot associated with vein wall inflammation   . Osteoarthritis of knees 01/16/2015  . Pulmonary embolism ([place]) 09/29/2015   Factor V Leiden mutation, on elequis   . Pure hypercholesterolemia 08/16/2014    Past Surgical History:  Procedure Laterality Date  . TUBAL LIGATION  2010    Family History  Problem Relation Age of Onset  . Arthritis Maternal Grandmother   . Hearing loss Maternal Grandmother   . Arthritis Maternal Grandfather   . Hearing loss Maternal Grandfather   . Clotting disorder Neg Hx     Social History   Socioeconomic History  . Marital status: Married    Spouse name: None  . Number of children: None  . Years of education: None  . Highest education level: None  Social Needs  . Financial resource strain: None  . Food insecurity - worry: None  . Food insecurity - inability: None  . Transportation needs - medical: None  . Transportation needs - non-medical: None  Occupational History  . None  Tobacco Use  . Smoking status: Never Smoker  . Smokeless tobacco: Never Used  Substance and Sexual Activity  . Alcohol use: No  . Drug use:  No  . Sexual activity: Yes    Birth control/protection: None  Other Topics Concern  . None  Social History Narrative   Work or School: works at [employer] Situation: lives with husband, 4 children - none of them live with them      Spiritual Beliefs: Christian      Lifestyle: walks to work daily - 20 minutes; diet is ok - [place] [employer]        Current Outpatient Medications:  .  aspirin 325 MG tablet, Take 325 mg by mouth daily., Disp: , Rfl:  .  IBUPROFEN PO, Take as needed by mouth. , Disp: , Rfl:  .  traZODone (DESYREL) 50 MG tablet, Take 1 tablet (50 mg total) by mouth 2 (two) times daily., Disp: 60 tablet, Rfl: 3  EXAM:  Vitals:   10/06/17 1120  BP: 120/60  Pulse: (!) 50  Temp: 98.5 F (36.9 C)    Body mass index is 29.33 kg/m.  GENERAL: vitals reviewed and listed above, alert, oriented, appears well hydrated and in no acute distress  HEENT: atraumatic, conjunttiva clear, no obvious abnormalities on inspection of external nose and ears  NECK: no obvious masses on inspection  LUNGS: clear to auscultation bilaterally, no wheezes, rales or rhonchi, good air movement  CV: HRRR, no peripheral edema  MS: moves all extremities without noticeable abnormality  PSYCH: pleasant and cooperative, no obvious depression or anxiety  ASSESSMENT AND PLAN:  Discussed the following assessment and plan:  Moderate episode of recurrent major depressive disorder (HCC)  -Discussed various options for treatment and risks at length -She has opted to continue the cognitive behavioral therapy through her church as she has no insurance, we also provided her a list of other options including [facility] given no insurance -She also wants to go ahead and try starting a medication and opted for trazodone -Follow-up 1 month -Return precautions and emergency precautions discussed, verbal contract for safety -Patient advised to return or notify a doctor immediately if  symptoms worsen or persist or new concerns arise and seek emergency care immediately for any severe symptoms  Patient Instructions  BEFORE YOU LEAVE: -follow up: 1 month  Start the trazodone and take once daily at night before bed.  After 1 week, you may increase to twice daily if needed.  Continue counseling as able.  Seek emergency care if worsening, severe symptoms or thoughts of harm to yourself or others.  Call 911 and go to the emergency room.      Colin Benton R., DO

## 2017-10-06 ENCOUNTER — Ambulatory Visit (INDEPENDENT_AMBULATORY_CARE_PROVIDER_SITE_OTHER): Payer: Self-pay | Admitting: Family Medicine

## 2017-10-06 ENCOUNTER — Encounter: Payer: Self-pay | Admitting: Family Medicine

## 2017-10-06 VITALS — BP 120/60 | HR 50 | Temp 98.5°F | Ht 66.0 in | Wt 181.7 lb

## 2017-10-06 DIAGNOSIS — F331 Major depressive disorder, recurrent, moderate: Secondary | ICD-10-CM

## 2017-10-06 MED ORDER — TRAZODONE HCL 50 MG PO TABS: 50.0000 mg | ORAL_TABLET | Freq: Two times a day (BID) | ORAL | 3 refills | Status: DC

## 2017-10-06 NOTE — Patient Instructions (Signed)
BEFORE YOU LEAVE: -follow up: 1 month  Start the trazodone and take once daily at night before bed.  After 1 week, you may increase to twice daily if needed.  Continue counseling as able.  Seek emergency care if worsening, severe symptoms or thoughts of harm to yourself or others.  Call 911 and go to the emergency room.

## 2017-10-27 ENCOUNTER — Encounter: Payer: Self-pay | Admitting: Family Medicine

## 2017-11-07 ENCOUNTER — Ambulatory Visit: Payer: Self-pay | Admitting: Family Medicine

## 2017-11-07 NOTE — Progress Notes (Signed)
HPI:  Follow-up depression: -She tried CBT, then started trazodone at her last visit -Reports doing much better, decreased depression, decreased hopelessness, improve sleep, improved motivation -She is doing counseling, has first visit today -She realized the medicine works best if she just takes it once daily in the evening before bed, she is taking 50 mg -she wants to continue this dose as she and her boyfriend have noticed a dramatic improvement -Denies thoughts of self-harm, suicidal ideation, severe symptoms  -see scanned PHQ9 -Has started exercise program  ROS: See pertinent positives and negatives per HPI.  Past Medical History:  Diagnosis Date  . Anemia    resolved, mild for some time - does give plasma twice per week which is likely etiology  . Bilateral headaches 01/16/2015   -chronic -associated with her menstrual periods   . Blood clot associated with vein wall inflammation   . Osteoarthritis of knees 01/16/2015  . Pulmonary embolism ([place]) 09/29/2015   Factor V Leiden mutation, on elequis   . Pure hypercholesterolemia 08/16/2014    Past Surgical History:  Procedure Laterality Date  . TUBAL LIGATION  2010    Family History  Problem Relation Age of Onset  . Arthritis Maternal Grandmother   . Hearing loss Maternal Grandmother   . Arthritis Maternal Grandfather   . Hearing loss Maternal Grandfather   . Clotting disorder Neg Hx     Social History   Socioeconomic History  . Marital status: Married    Spouse name: None  . Number of children: None  . Years of education: None  . Highest education level: None  Social Needs  . Financial resource strain: None  . Food insecurity - worry: None  . Food insecurity - inability: None  . Transportation needs - medical: None  . Transportation needs - non-medical: None  Occupational History  . None  Tobacco Use  . Smoking status: Never Smoker  . Smokeless tobacco: Never Used  Substance and Sexual Activity  . Alcohol  use: No  . Drug use: No  . Sexual activity: Yes    Birth control/protection: None  Other Topics Concern  . None  Social History Narrative   Work or School: works at [employer] Situation: lives with husband, 4 children - none of them live with them      Spiritual Beliefs: Christian      Lifestyle: walks to work daily - 20 minutes; diet is ok - [place] [employer]        Current Outpatient Medications:  .  aspirin 325 MG tablet, Take 325 mg by mouth daily., Disp: , Rfl:  .  IBUPROFEN PO, Take as needed by mouth. , Disp: , Rfl:  .  traZODone (DESYREL) 50 MG tablet, Take 1 tablet (50 mg total) by mouth at bedtime., Disp: 90 tablet, Rfl: 1  EXAM:  Vitals:   11/08/17 0833  BP: 110/80  Pulse: 66  Temp: (!) 97.4 F (36.3 C)    Body mass index is 29.1 kg/m.  GENERAL: vitals reviewed and listed above, alert, oriented, appears well hydrated and in no acute distress  HEENT: atraumatic, conjunttiva clear, no obvious abnormalities on inspection of external nose and ears  NECK: no obvious masses on inspection  LUNGS: clear to auscultation bilaterally, no wheezes, rales or rhonchi, good air movement  CV: HRRR, no peripheral edema  MS: moves all extremities without noticeable abnormality  PSYCH: pleasant and cooperative, no obvious depression or anxiety  ASSESSMENT  AND PLAN:  Discussed the following assessment and plan:  Recurrent major depressive disorder, in full remission ([place])  -Continue trazodone 50 mg nightly -Continue exercise and counseling -Follow-up 3 months -Patient advised to return or notify a doctor immediately if symptoms worsen or persist or new concerns arise.  Patient Instructions  BEFORE YOU LEAVE: -follow up: 3 months  I am so glad that you are feeling better!  Continue the trazodone once daily at night.  Continue the counseling.  Continue the regular exercise.  Follow-up sooner if any worsening, new symptoms or other  concerns.    Sarah Kern, DO

## 2017-11-08 ENCOUNTER — Encounter: Payer: Self-pay | Admitting: Family Medicine

## 2017-11-08 ENCOUNTER — Ambulatory Visit (INDEPENDENT_AMBULATORY_CARE_PROVIDER_SITE_OTHER): Payer: Self-pay | Admitting: Family Medicine

## 2017-11-08 VITALS — BP 110/80 | HR 66 | Temp 97.4°F | Ht 66.0 in | Wt 180.3 lb

## 2017-11-08 DIAGNOSIS — F3342 Major depressive disorder, recurrent, in full remission: Secondary | ICD-10-CM

## 2017-11-08 MED ORDER — TRAZODONE HCL 50 MG PO TABS: 50.0000 mg | ORAL_TABLET | Freq: Every day | ORAL | 1 refills | Status: DC

## 2017-11-08 NOTE — Patient Instructions (Signed)
BEFORE YOU LEAVE: -follow up: 3 months  I am so glad that you are feeling better!  Continue the trazodone once daily at night.  Continue the counseling.  Continue the regular exercise.  Follow-up sooner if any worsening, new symptoms or other concerns.

## 2017-11-24 ENCOUNTER — Ambulatory Visit (INDEPENDENT_AMBULATORY_CARE_PROVIDER_SITE_OTHER): Payer: Self-pay | Admitting: Family Medicine

## 2017-11-24 ENCOUNTER — Encounter: Payer: Self-pay | Admitting: Family Medicine

## 2017-11-24 VITALS — BP 136/76 | HR 66 | Temp 97.5°F | Ht 66.0 in | Wt 181.3 lb

## 2017-11-24 DIAGNOSIS — M545 Low back pain, unspecified: Secondary | ICD-10-CM

## 2017-11-24 DIAGNOSIS — Z862 Personal history of diseases of the blood and blood-forming organs and certain disorders involving the immune mechanism: Secondary | ICD-10-CM

## 2017-11-24 DIAGNOSIS — Z86718 Personal history of other venous thrombosis and embolism: Secondary | ICD-10-CM

## 2017-11-24 DIAGNOSIS — R202 Paresthesia of skin: Secondary | ICD-10-CM

## 2017-11-24 NOTE — Progress Notes (Signed)
HPI:  Sarah Leach is a very pleasant 37 year old with a past medical history of factor V Leiden by pulmonary embolism here for an acute visit for several problems.  She reports that the back pain reminds her of when she had a pulmonary embolism past, she had some chest pain and shortness of breath yesterday, had a numb sensation in both hands and both legs.  Back pain is severe and she reports she has had difficulty with walking and bending due to the severe pain.  Denies chest pain or shortness of breath currently, palpitations, fevers, rashes, nasal congestion, cough, dysuria, weakness in the legs or neck pain or headache.  Denies any significant worsening stress or depression.  She is off of Eliquis due to cost and her hematologist and her had decided on aspirin instead.  ROS: See pertinent positives and negatives per HPI.  Past Medical History:  Diagnosis Date  . Anemia    resolved, mild for some time - does give plasma twice per week which is likely etiology  . Bilateral headaches 01/16/2015   -chronic -associated with her menstrual periods   . Blood clot associated with vein wall inflammation   . Osteoarthritis of knees 01/16/2015  . Pulmonary embolism ([place]) 09/29/2015   Factor V Leiden mutation, on elequis   . Pure hypercholesterolemia 08/16/2014    Past Surgical History:  Procedure Laterality Date  . TUBAL LIGATION  2010    Family History  Problem Relation Age of Onset  . Arthritis Maternal Grandmother   . Hearing loss Maternal Grandmother   . Arthritis Maternal Grandfather   . Hearing loss Maternal Grandfather   . Clotting disorder Neg Hx     Social History   Socioeconomic History  . Marital status: Married    Spouse name: None  . Number of children: None  . Years of education: None  . Highest education level: None  Social Needs  . Financial resource strain: None  . Food insecurity - worry: None  . Food insecurity - inability: None  . Transportation needs -  medical: None  . Transportation needs - non-medical: None  Occupational History  . None  Tobacco Use  . Smoking status: Never Smoker  . Smokeless tobacco: Never Used  Substance and Sexual Activity  . Alcohol use: No  . Drug use: No  . Sexual activity: Yes    Birth control/protection: None  Other Topics Concern  . None  Social History Narrative   Work or School: works at [employer] Situation: lives with husband, 4 children - none of them live with them      Spiritual Beliefs: Christian      Lifestyle: walks to work daily - 20 minutes; diet is ok - [place] [employer]        Current Outpatient Medications:  .  aspirin 325 MG tablet, Take 325 mg by mouth daily., Disp: , Rfl:  .  IBUPROFEN PO, Take as needed by mouth. , Disp: , Rfl:  .  traZODone (DESYREL) 50 MG tablet, Take 1 tablet (50 mg total) by mouth at bedtime., Disp: 90 tablet, Rfl: 1  EXAM:  Vitals:   11/24/17 1404  BP: 136/76  Pulse: 66  Temp: (!) 97.5 F (36.4 C)    Body mass index is 29.26 kg/m.  GENERAL: vitals reviewed and listed above, alert, oriented, appears well hydrated, she appears uncomfortable  HEENT: atraumatic, conjunttiva clear, no obvious abnormalities on inspection of external nose and  ears  NECK: no obvious masses on inspection  LUNGS: clear to auscultation bilaterally, no wheezes, rales or rhonchi, good air movement  CV: HRRR, tr peripheral edema in bilateral ankles, without tenderness to palpation in the child's  MS: moves all extremities without noticeable abnormality, tenderness to palpation diffusely throughout in the lumbar paraspinal tissues that seems out of character for comment and benign musculoskeletal etiologies, normal strength bilateral lower and upper extremities, reflexes seem to be mildly hyperactive and but equal bilaterally, she can feel sensitivity to light touch, but reports it feels odd in the bilateral lower extremities  PSYCH: pleasant and cooperative, no  obvious depression or anxiety  ASSESSMENT AND PLAN:  Discussed the following assessment and plan:  Severe low back pain  Paresthesias  History of blood clots  History of factor V Leiden mutation  -we discussed possible serious and likely etiologies, workup and treatment, treatment risks and return precautions -given the severe nature of her symptoms, symptoms she had yesterday and her history I have advised a workup at the emergency room -after this discussion, Surena opted for evaluation and [facility] -Given the severe nature of her pain, I advised EMS transport, she declined as her boyfriend will take her -Advertising account planner to notify the emergency room staff     There are no Patient Instructions on file for this visit.  [name], DO

## 2018-02-04 NOTE — Progress Notes (Signed)
HPI:  Using dictation device. Unfortunately this device frequently misinterprets words/phrases.  Recurrent MDD: -did CBT 2018 ad then trazodone -reports is doing great -taking trazadone daily - wishes to continue -getting regular exercise at [employer] -has counselor at church - but uses as needed now -denies depression, SI, thought of harm -one day per week a little lack of motivation, but over all much better  Hx Pulm Embolism ,Factor V Leiden: -was managed by oncology and eliquis was stopped and she was told to take asa forever 2ndary to cost issues -no cp, sob, swelling  ROS: See pertinent positives and negatives per HPI.  Past Medical History:  Diagnosis Date  . Anemia    resolved, mild for some time - does give plasma twice per week which is likely etiology  . Bilateral headaches 01/16/2015   -chronic -associated with her menstrual periods   . Blood clot associated with vein wall inflammation   . Osteoarthritis of knees 01/16/2015  . Pulmonary embolism ([place]) 09/29/2015   Factor V Leiden mutation, on elequis   . Pure hypercholesterolemia 08/16/2014    Past Surgical History:  Procedure Laterality Date  . TUBAL LIGATION  2010    Family History  Problem Relation Age of Onset  . Arthritis Maternal Grandmother   . Hearing loss Maternal Grandmother   . Arthritis Maternal Grandfather   . Hearing loss Maternal Grandfather   . Clotting disorder Neg Hx     SOCIAL HX: see above   Current Outpatient Medications:  .  aspirin 325 MG tablet, Take 325 mg by mouth daily., Disp: , Rfl:  .  traZODone (DESYREL) 50 MG tablet, Take 1 tablet (50 mg total) by mouth at bedtime., Disp: 90 tablet, Rfl: 1  EXAM:  Vitals:   02/06/18 0923  BP: 118/78  Pulse: (!) 49  Temp: 97.9 F (36.6 C)    Body mass index is 26.74 kg/m.  GENERAL: vitals reviewed and listed above, alert, oriented, appears well hydrated and in no acute distress  HEENT: atraumatic, conjunttiva clear, no  obvious abnormalities on inspection of external nose and ears  NECK: no obvious masses on inspection  LUNGS: clear to auscultation bilaterally, no wheezes, rales or rhonchi, good air movement  CV: HRRR, no peripheral edema  MS: moves all extremities without noticeable abnormality  PSYCH: pleasant and cooperative, no obvious depression or anxiety  ASSESSMENT AND PLAN:  Discussed the following assessment and plan:  Recurrent major depressive disorder, in full remission (HCC)  Thrombocytosis ([place])  Factor V Leiden ([place])  Hyperlipidemia, unspecified hyperlipidemia type  BMI 26.0-26.9,adult  -glad she is doing so much better -refilled trazadone -congratulated on exercise, lifestyle recs -follow up 4-6 months -Patient advised to return or notify a doctor immediately if symptoms worsen or persist or new concerns arise.  Patient Instructions  BEFORE YOU LEAVE: -PHq9 in epic -follow up: 4-6 months   We recommend the following healthy lifestyle for LIFE: 1) Small portions. But, make sure to get regular (at least 3 per day), healthy meals and small healthy snacks if needed.  2) Eat a healthy clean diet.   TRY TO EAT: -at least 5-7 servings of low sugar, colorful, and nutrient rich vegetables per day (not corn, potatoes or bananas.) -berries are the best choice if you wish to eat fruit (only eat small amounts if trying to reduce weight)  -lean meets (fish, white meat of chicken or [place]) -vegan proteins for some meals - beans or tofu, whole grains, nuts and seeds -Replace  bad fats with good fats - good fats include: fish, nuts and seeds, canola oil, olive oil -small amounts of low fat or non fat dairy -small amounts of100 % whole grains - check the lables -drink plenty of water  AVOID: -SUGAR, sweets, anything with added sugar, corn syrup or sweeteners - must read labels as even foods advertised as "healthy" often are loaded with sugar -if you must have a sweetener, small  amounts of stevia may be best -sweetened beverages and artificially sweetened beverages -simple starches (rice, bread, potatoes, pasta, chips, etc - small amounts of 100% whole grains are ok) -red meat, pork, butter -fried foods, fast food, processed food, excessive dairy, eggs and coconut.  3)Get at least 150 minutes of sweaty aerobic exercise per week.  4)Reduce stress - consider counseling, meditation and relaxation to balance other aspects of your life.     [name], DO

## 2018-02-06 ENCOUNTER — Encounter: Payer: Self-pay | Admitting: Family Medicine

## 2018-02-06 ENCOUNTER — Ambulatory Visit (INDEPENDENT_AMBULATORY_CARE_PROVIDER_SITE_OTHER): Payer: Self-pay | Admitting: Family Medicine

## 2018-02-06 VITALS — BP 118/78 | HR 49 | Temp 97.9°F | Ht 66.0 in | Wt 165.7 lb

## 2018-02-06 DIAGNOSIS — D6851 Activated protein C resistance: Secondary | ICD-10-CM

## 2018-02-06 DIAGNOSIS — F3342 Major depressive disorder, recurrent, in full remission: Secondary | ICD-10-CM

## 2018-02-06 DIAGNOSIS — E785 Hyperlipidemia, unspecified: Secondary | ICD-10-CM

## 2018-02-06 DIAGNOSIS — Z6826 Body mass index (BMI) 26.0-26.9, adult: Secondary | ICD-10-CM

## 2018-02-06 DIAGNOSIS — D473 Essential (hemorrhagic) thrombocythemia: Secondary | ICD-10-CM

## 2018-02-06 DIAGNOSIS — D75839 Thrombocytosis, unspecified: Secondary | ICD-10-CM

## 2018-02-06 MED ORDER — TRAZODONE HCL 50 MG PO TABS: 50.0000 mg | ORAL_TABLET | Freq: Every day | ORAL | 1 refills | Status: AC

## 2018-02-06 NOTE — Patient Instructions (Signed)
BEFORE YOU LEAVE: -PHq9 in epic -follow up: 4-6 months   We recommend the following healthy lifestyle for LIFE: 1) Small portions. But, make sure to get regular (at least 3 per day), healthy meals and small healthy snacks if needed.  2) Eat a healthy clean diet.   TRY TO EAT: -at least 5-7 servings of low sugar, colorful, and nutrient rich vegetables per day (not corn, potatoes or bananas.) -berries are the best choice if you wish to eat fruit (only eat small amounts if trying to reduce weight)  -lean meets (fish, white meat of chicken or [place]) -vegan proteins for some meals - beans or tofu, whole grains, nuts and seeds -Replace bad fats with good fats - good fats include: fish, nuts and seeds, canola oil, olive oil -small amounts of low fat or non fat dairy -small amounts of100 % whole grains - check the lables -drink plenty of water  AVOID: -SUGAR, sweets, anything with added sugar, corn syrup or sweeteners - must read labels as even foods advertised as "healthy" often are loaded with sugar -if you must have a sweetener, small amounts of stevia may be best -sweetened beverages and artificially sweetened beverages -simple starches (rice, bread, potatoes, pasta, chips, etc - small amounts of 100% whole grains are ok) -red meat, pork, butter -fried foods, fast food, processed food, excessive dairy, eggs and coconut.  3)Get at least 150 minutes of sweaty aerobic exercise per week.  4)Reduce stress - consider counseling, meditation and relaxation to balance other aspects of your life.

## 2018-04-07 ENCOUNTER — Ambulatory Visit (INDEPENDENT_AMBULATORY_CARE_PROVIDER_SITE_OTHER): Payer: Self-pay | Admitting: Family Medicine

## 2018-04-07 ENCOUNTER — Encounter: Payer: Self-pay | Admitting: Family Medicine

## 2018-04-07 VITALS — BP 102/68 | HR 81 | Temp 98.2°F | Ht 66.0 in | Wt 182.0 lb

## 2018-04-07 DIAGNOSIS — N760 Acute vaginitis: Secondary | ICD-10-CM

## 2018-04-07 MED ORDER — FLUCONAZOLE 150 MG PO TABS: 150.0000 mg | ORAL_TABLET | Freq: Once | ORAL | 2 refills | Status: AC

## 2018-04-07 NOTE — Progress Notes (Signed)
   Subjective:    Patient ID: Sarah Leach, female    DOB: 1980/11/07, 37 y.o.   MRN: [id number]  HPI Here for 3 weeks of intermittent whitish vaginal DC and intense itching in the perineal area. No odor or cramps. No urinary symptoms. She has tried Monistat with no relief.    Review of Systems  Constitutional: Negative.   Respiratory: Negative.   Cardiovascular: Negative.   Genitourinary: Positive for vaginal discharge.       Objective:   Physical Exam  Constitutional: She appears well-developed and well-nourished.  Cardiovascular: Normal rate, regular rhythm, normal heart sounds and intact distal pulses.  Pulmonary/Chest: Effort normal and breath sounds normal.          Assessment & Plan:  Vaginitis, treat with Diflucan.  Alysia Penna, MD

## 2018-06-07 NOTE — Progress Notes (Signed)
HPI:  Using dictation device. Unfortunately this device frequently misinterprets words/phrases.  Sarah Leach is a pleasant 37 y.o. here for follow up. Chronic medical problems summarized below were reviewed for changes and stability and were updated as needed below. These issues and their treatment remain stable for the most part.  Reports is doing really well.  She is enrolled in school for business administrative degree.  She and her significant other plan to get married at some point.  Her mood has been great.  Reports her sleep has been better.  She denies any depression, shortness of breath, chest pain, leg swelling.  She is trying to eat healthier and is trying to get 6000 steps daily. Denies CP, SOB, DOE, treatment intolerance or new symptoms.  Recurrent MDD: -did CBT 2018 ad then trazodone -reports is doing great -taking trazadone daily - wishes to continue -getting regular exercise at [employer] -has counselor at church - but uses as needed now -denies depression, SI, thought of harm -one day per week a little lack of motivation, but over all much better  Hx Pulm Embolism ,Factor V Leiden: -was managed by oncology and eliquis was stopped and she was told to take asa forever 2ndary to cost issues -no cp, sob, swelling  Hyperlipidemia/Prediabetes: -Working on diet and exercise -No chest pain, shortness of breath or vision changes  ROS: See pertinent positives and negatives per HPI.  Past Medical History:  Diagnosis Date  . Anemia    resolved, mild for some time - does give plasma twice per week which is likely etiology  . Bilateral headaches 01/16/2015   -chronic -associated with her menstrual periods   . Blood clot associated with vein wall inflammation   . Osteoarthritis of knees 01/16/2015  . Pulmonary embolism ([place]) 09/29/2015   Factor V Leiden mutation, on elequis   . Pure hypercholesterolemia 08/16/2014    Past Surgical History:  Procedure Laterality Date  .  TUBAL LIGATION  2010    Family History  Problem Relation Age of Onset  . Arthritis Maternal Grandmother   . Hearing loss Maternal Grandmother   . Arthritis Maternal Grandfather   . Hearing loss Maternal Grandfather   . Clotting disorder Neg Hx     SOCIAL HX: See HPI   Current Outpatient Medications:  .  aspirin 325 MG tablet, Take 325 mg by mouth daily., Disp: , Rfl:  .  traZODone (DESYREL) 50 MG tablet, Take 1 tablet (50 mg total) by mouth at bedtime., Disp: 90 tablet, Rfl: 1  EXAM:  Vitals:   06/08/18 0913  BP: 100/80  Pulse: (!) 58  Temp: 98.3 F (36.8 C)  SpO2: 98%    Body mass index is 29.05 kg/m.  GENERAL: vitals reviewed and listed above, alert, oriented, appears well hydrated and in no acute distress  HEENT: atraumatic, conjunttiva clear, no obvious abnormalities on inspection of external nose and ears  NECK: no obvious masses on inspection  LUNGS: clear to auscultation bilaterally, no wheezes, rales or rhonchi, good air movement  CV: HRRR, no peripheral edema  MS: moves all extremities without noticeable abnormality  PSYCH: pleasant and cooperative, no obvious depression or anxiety  ASSESSMENT AND PLAN:  Discussed the following assessment and plan:  Recurrent major depressive disorder, in full remission ([place]) -Mood is stable, continue current treatment  Thrombocytosis ([place]) - Plan: Basic metabolic panel, CBC Factor V Leiden ([place]) -Advised labs today, however she declined and wishes to do at a later time due to insurance issues  Hyperlipidemia, unspecified hyperlipidemia type - Plan: HDL cholesterol, Hemoglobin A1c Hyperglycemia - Plan: Cholesterol, total -Congratulated on healthier lifestyle -Advised labs today, however she would prefer to do it follow-up when she gets her flu shot -Future orders placed  -Patient advised to return or notify a doctor immediately if symptoms worsen or persist or new concerns arise.  Patient Instructions   BEFORE YOU LEAVE: -labs -follow up: 4 month  Get Flu shot in October.  We have ordered labs or studies at this visit. It can take up to 1-2 weeks for results and processing. IF results require follow up or explanation, we will call you with instructions. Clinically stable results will be released to your [facility]. If you have not heard from [place] or cannot find your results in [facility] in 2 weeks please contact our office at [phone].  If you are not yet signed up for [facility], please consider signing up.   We recommend the following healthy lifestyle for LIFE: 1) Small portions. But, make sure to get regular (at least 3 per day), healthy meals and small healthy snacks if needed.  2) Eat a healthy clean diet.   TRY TO EAT: -at least 5-7 servings of low sugar, colorful, and nutrient rich vegetables per day (not corn, potatoes or bananas.) -berries are the best choice if you wish to eat fruit (only eat small amounts if trying to reduce weight)  -lean meets (fish, white meat of chicken or [place]) -vegan proteins for some meals - beans or tofu, whole grains, nuts and seeds -Replace bad fats with good fats - good fats include: fish, nuts and seeds, canola oil, olive oil -small amounts of low fat or non fat dairy -small amounts of100 % whole grains - check the lables -drink plenty of water  AVOID: -SUGAR, sweets, anything with added sugar, corn syrup or sweeteners - must read labels as even foods advertised as "healthy" often are loaded with sugar -if you must have a sweetener, small amounts of stevia may be best -sweetened beverages and artificially sweetened beverages -simple starches (rice, bread, potatoes, pasta, chips, etc - small amounts of 100% whole grains are ok) -red meat, pork, butter -fried foods, fast food, processed food, excessive dairy, eggs and coconut.  3)Get at least 150 minutes of sweaty aerobic exercise per week.  4)Reduce stress - consider counseling, meditation and  relaxation to balance other aspects of your life.          [name], DO

## 2018-06-08 ENCOUNTER — Ambulatory Visit (INDEPENDENT_AMBULATORY_CARE_PROVIDER_SITE_OTHER): Payer: Self-pay | Admitting: Family Medicine

## 2018-06-08 ENCOUNTER — Encounter: Payer: Self-pay | Admitting: Family Medicine

## 2018-06-08 VITALS — BP 100/80 | HR 58 | Temp 98.3°F | Ht 66.0 in | Wt 180.0 lb

## 2018-06-08 DIAGNOSIS — D75839 Thrombocytosis, unspecified: Secondary | ICD-10-CM

## 2018-06-08 DIAGNOSIS — D473 Essential (hemorrhagic) thrombocythemia: Secondary | ICD-10-CM

## 2018-06-08 DIAGNOSIS — D6851 Activated protein C resistance: Secondary | ICD-10-CM

## 2018-06-08 DIAGNOSIS — R739 Hyperglycemia, unspecified: Secondary | ICD-10-CM

## 2018-06-08 DIAGNOSIS — F3342 Major depressive disorder, recurrent, in full remission: Secondary | ICD-10-CM

## 2018-06-08 DIAGNOSIS — E785 Hyperlipidemia, unspecified: Secondary | ICD-10-CM

## 2018-06-08 NOTE — Patient Instructions (Signed)
BEFORE YOU LEAVE: -labs -follow up: 4 month  Get Flu shot in October.  We have ordered labs or studies at this visit. It can take up to 1-2 weeks for results and processing. IF results require follow up or explanation, we will call you with instructions. Clinically stable results will be released to your [facility]. If you have not heard from [place] or cannot find your results in [facility] in 2 weeks please contact our office at [phone].  If you are not yet signed up for [facility], please consider signing up.   We recommend the following healthy lifestyle for LIFE: 1) Small portions. But, make sure to get regular (at least 3 per day), healthy meals and small healthy snacks if needed.  2) Eat a healthy clean diet.   TRY TO EAT: -at least 5-7 servings of low sugar, colorful, and nutrient rich vegetables per day (not corn, potatoes or bananas.) -berries are the best choice if you wish to eat fruit (only eat small amounts if trying to reduce weight)  -lean meets (fish, white meat of chicken or [place]) -vegan proteins for some meals - beans or tofu, whole grains, nuts and seeds -Replace bad fats with good fats - good fats include: fish, nuts and seeds, canola oil, olive oil -small amounts of low fat or non fat dairy -small amounts of100 % whole grains - check the lables -drink plenty of water  AVOID: -SUGAR, sweets, anything with added sugar, corn syrup or sweeteners - must read labels as even foods advertised as "healthy" often are loaded with sugar -if you must have a sweetener, small amounts of stevia may be best -sweetened beverages and artificially sweetened beverages -simple starches (rice, bread, potatoes, pasta, chips, etc - small amounts of 100% whole grains are ok) -red meat, pork, butter -fried foods, fast food, processed food, excessive dairy, eggs and coconut.  3)Get at least 150 minutes of sweaty aerobic exercise per week.  4)Reduce stress - consider counseling, meditation and  relaxation to balance other aspects of your life.

## 2018-09-17 DEATH — deceased

## 2018-09-18 ENCOUNTER — Telehealth: Payer: Self-pay

## 2018-09-18 NOTE — Telephone Encounter (Signed)
Sarah Leach from [facility] calling because they need death certificate signed by tomorrow for funeral service. EMS reportedly spoke to Dr. Nani Ravens, and reportedly Dr. Maudie Mercury, pt's PCP, will not sign certificate. Sarah Leach needs call back ASAP to [phone]. Routed to both Dr. Nani Ravens and Dr. Maudie Mercury to resolve.

## 2018-09-18 NOTE — Telephone Encounter (Signed)
Will need details of death and likely ME case given age.

## 2018-09-18 NOTE — Telephone Encounter (Signed)
Called Randalyn Rhea (EMS that spoke to Dr. Nani Ravens) at [phone] left message to call back. Also called Meryle Ready (EMS) at [phone] supervisor of Randalyn Rhea and he stated that the patient had a significant history of blood clots.

## 2018-09-18 NOTE — Telephone Encounter (Signed)
Spoke with Opal Sidles at [facility] and advised her we were working on this issue. Opal Sidles advised the family is not requesting an autopsy. The funeral service is scheduled for tomorrow, 02-10-2023, and they need death certificate signed as soon as possible.  Spoke with [facility] as we received a faxed note from a call to the triage nurse on 10-16-18 at 8:55am from [facility] with notification of death of a pt.[employer] patched call from dispatch to Dr. Nani Ravens. No patient information was give to [facility] directly. Dr. Nani Ravens does not recall patient information from call, only that dispatch/EMS advised pt had been pronounced in field.   Spoke with [facility] and they state they can confirm call was from pt's residence on 10-16-18 at 8am with c/o pt's boyfriend finding pt "unconscious on the bed" and they listed it as possible cardiac arrest. EMS arrived on scene and pronounced pt deceased.They also advised pt's family states pt had been recently hospitalized for medical issues.They have record of EMT speaking with Dr. Nani Ravens who agreed with call and advised PCP would sign DC.   Per chart pt was recently admitted to [facility] on 08/28/18 and diagnosed with a saddle PE. Pt was discharged 09/01/18 with instructions to take Eliquis daily.   Spoke with Dr. Maudie Mercury and advised of all information above. She has agreed to sign death certificate. This has been placed on her desk for completion.  Jane at [facility] has been advised certificate will be signed in the morning and faxed to them asap. She said that was fine.   Dr. Maudie Mercury - Per our conversation today. Thank you!

## 2018-09-18 NOTE — Telephone Encounter (Signed)
Spoke with Sarah Leach this afternoon and she was able to track down details of the death. Offered to return to office to sign, but was told would be ok to sign in the morning. Will sign certificate when back in office first thing in the monring.

## 2018-09-18 NOTE — Telephone Encounter (Signed)
Copied from [facility] [phone]. Topic: General - Deceased Patient >> 10/05/18 10:02 AM Ivar Drape wrote: Reason for CRM:   Opal Sidles w/[facility] [phone] wanted to inform the provider that the patient passed away Oct 03, 2018.  She will be faxing a death certificate over for a signature and she will be mailing one as well.  Route to department's PEC Pool.

## 2018-10-17 ENCOUNTER — Ambulatory Visit: Payer: Self-pay | Admitting: Family Medicine
